# Patient Record
Sex: Male | Born: 1990 | Race: White | Hispanic: No | Marital: Married | Smoking: Never smoker
Health system: Southern US, Community
[De-identification: ages and names within clinical notes are randomized; demographics above are authoritative.]

---

## 2017-03-04 ENCOUNTER — Encounter (HOSPITAL_COMMUNITY): Payer: Self-pay

## 2017-03-04 ENCOUNTER — Emergency Department (HOSPITAL_COMMUNITY): Payer: BC Managed Care – PPO

## 2017-03-04 ENCOUNTER — Emergency Department
Admission: EM | Admit: 2017-03-04 | Discharge: 2017-03-04 | Disposition: A | Discharge status: Home / Self Care | Payer: BC Managed Care – PPO | Attending: Emergency Medicine | Admitting: Emergency Medicine

## 2017-03-04 DIAGNOSIS — R079 Chest pain, unspecified: Secondary | ICD-10-CM | POA: Insufficient documentation

## 2017-03-04 LAB — BASIC METABOLIC PANEL
ANION GAP: 8 mmol/L
BUN/CREA RATIO: 20
BUN: 19 mg/dL (ref 10–25)
CALCIUM: 9.7 mg/dL (ref 8.2–10.2)
CHLORIDE: 100 mmol/L (ref 98–111)
CO2 TOTAL: 29 mmol/L (ref 21–35)
CREATININE: 0.94 mg/dL (ref <=1.30)
ESTIMATED GFR: >60 mL/min/1.73mˆ2
GLUCOSE: 94 mg/dL (ref 70–110)
POTASSIUM: 3.6 mmol/L (ref 3.5–5.0)
SODIUM: 137 mmol/L (ref 135–145)

## 2017-03-04 LAB — CBC WITH DIFF
BASOPHIL #: 0 x10ˆ3/uL (ref 0.00–0.20)
BASOPHIL %: 1 %
EOSINOPHIL #: 0.3 x10ˆ3/uL (ref 0.00–0.50)
EOSINOPHIL %: 5 %
HCT: 46 % (ref 38.9–50.5)
HGB: 16.1 g/dL (ref 13.4–17.3)
LYMPHOCYTE #: 2.1 x10ˆ3/uL (ref 0.80–3.20)
LYMPHOCYTE %: 31 %
MCH: 30.1 pg (ref 27.9–33.1)
MCHC: 35 g/dL (ref 32.8–36.0)
MCV: 86 fL (ref 82.4–95.0)
MONOCYTE #: 0.9 x10ˆ3/uL — ABNORMAL HIGH (ref 0.20–0.80)
MONOCYTE %: 13 %
MPV: 7.1 fL (ref 6.0–10.2)
NEUTROPHIL #: 3.4 x10ˆ3/uL (ref 1.60–5.50)
NEUTROPHIL %: 50 %
PLATELETS: 286 x10ˆ3/uL (ref 140–440)
RBC: 5.34 10*6/uL (ref 4.40–5.68)
RDW: 13.3 % (ref 10.9–15.1)
WBC: 6.8 x10ˆ3/uL (ref 3.3–9.3)

## 2017-03-04 LAB — ECG 12 LEAD - ED USE
Calculated P Axis: 59 deg
Calculated T Axis: 16 deg
Heart Rate: 69 {beats}/min
I 40 Axis: 48 deg
QRS Axis: 70 deg
QRS Duration: 86 ms
QT Interval: 371 ms
QTC Calculation: 398 ms
QTC Calculation: 398 ms
T 40 Axis: 98 deg

## 2017-03-04 LAB — TROPONIN-I: TROPONIN I: 0 ng/mL (ref <=0.04)

## 2017-03-04 NOTE — ED Provider Notes (Signed)
[facility]  Emergency Department  HPI - 03/04/2017    Chief Complaint:   Chest tightness    History of Present Illness:   Devon Grimes, 26 y.o. male   Significant PMH: None   The patient presents to the ED via EMS for evaluation of chest tightness which occurred just PTA while driving. States he developed a tightness in his lower chest and upper abdomen with associated paresthesias to his posterior neck, tingling to his LUE, and lightheadedness. His symptoms lasted ~15 minutes ago and are currently resolved. He does report a similar episode in the past for which he underwent evaluation at that time, which he reports was negative. He denies recent injuries, surgeries, or hospitalizations. He denies PMHx, including anxiety, or daily medications. FHx of CAD and anxiety, but no FHx of sudden cardiac death. States intermittent alcohol use, noting one case of beer over the course of several days. He denies tobacco or drug use. No associated nausea, vomiting, diarrhea, constipation, melena, hematochezia, difficulty urinating, dysuria, hematuria, dark-colored urine, abdominal pain, dizziness, tinnitus, recent illness, shortness of breath, diaphoresis, cough, vision changes, hemoptysis, leg swelling, calf pain, and all other complaints at this time. Vaccines are UTD. Of note, he denies any recent chemical exposures.      History Limitations: None    Review of Systems:   Constitutional: No fever, chills, weakness, recent illness   Skin: No rashes, laceration, wounds, diaphoresis   HENT: No headaches, congestion, tinnitus   Eyes: No vision changes, discharge   Cardio: No palpitations or leg swelling, +chest tightness   Respiratory: No cough, wheezing, SOB, hemoptysis   GI:  No nausea, vomiting, diarrhea, constipation, melena, hematochezia, abdominal pain   GU: No dysuria, hematuria, polyuria, difficulty urinating, dark-colored urine   MSK: No joint or back pain, calf pain   Neuro: No focal deficits, dizziness,  +paresthesias to posterior neck, tingling to LUE, lightheadedness  All others negative except those mentioned in the HPI     Medications:  None     Allergies:  No Known Allergies    Past Medical History:  History reviewed. No pertinent past medical history.    Past Surgical History:  History reviewed. No past surgical history pertinent negatives.    Social History:  Social History     Social History   . Marital status: Married     Spouse name: N/A   . Number of children: N/A   . Years of education: N/A     Occupational History   . Not on file.     Social History Main Topics   . Smoking status: Not on file   . Smokeless tobacco: Not on file   . Alcohol use Not on file   . Drug use: Not on file   . Sexual activity: Not on file     Other Topics Concern   . Not on file     Social History Narrative   . No narrative on file     Family History:  Family Medical History:     None        Physical Exam:  All nurse's notes reviewed.  ED Triage Vitals   Enc Vitals Group      BP (Non-Invasive) 03/04/17 0006 151/96      Heart Rate 03/04/17 0006 69      Respiratory Rate 03/04/17 0006 16      Temperature 03/04/17 0006 36.2 C (97.2 F)      Temp src --  SpO2-1 03/04/17 0006 99 %      Weight 03/04/17 0006 72.6 kg (160 lb)      Height --       Patient does not need supplemental oxygen     Constitutional: NAD. A+Ox3   HENT:    Head: NC AT    Mouth/Throat: Oropharynx is clear and moist.    Eyes: PERRL, EOMI, Conjunctivae without discharge   Neck: Trachea midline.    Cardiovascular: RRR, No murmurs, rubs or gallops.    Pulmonary/Chest: BS equal bilaterally, good air movement. No respiratory distress. No wheezes, rales or chest tenderness.    Abdominal: BS +. Abdomen soft, no tenderness, rebound or guarding.                Musculoskeletal: No obvious deformity, swelling.  No tenderness   Skin: Warm and dry. No rash, erythema, pallor or cyanosis   Psychiatric: Behavior is normal. Mood and affect congruent.      Neurological: Alert&Ox3. Grossly intact.     Labs:  Results for orders placed or performed during the hospital encounter of 03/04/17 (from the past 24 hour(s))   BASIC METABOLIC PANEL   Result Value Ref Range    SODIUM 137 135 - 145 mmol/L    POTASSIUM 3.6 3.5 - 5.0 mmol/L    CHLORIDE 100 98 - 111 mmol/L    CO2 TOTAL 29 21 - 35 mmol/L    ANION GAP 8 mmol/L    CALCIUM 9.7 8.2 - 10.2 mg/dL    GLUCOSE 94 70 - 981 mg/dL    BUN 19 10 - 25 mg/dL    CREATININE 1.91 <=4.78 mg/dL    BUN/CREA RATIO 20     ESTIMATED GFR >60 Avg: 116 mL/min/1.44m^2   CBC/DIFF    Narrative    The following orders were created for panel order CBC/DIFF.  Procedure                               Abnormality         Status                     ---------                               -----------         ------                     CBC WITH DIFF[[id number]]                Abnormal            Final result                 Please view results for these tests on the individual orders.   TROPONIN-I   Result Value Ref Range    TROPONIN I 0.00 <=0.04 ng/mL   CBC WITH DIFF   Result Value Ref Range    WBC 6.8 3.3 - 9.3 x10^3/uL    RBC 5.34 4.40 - 5.68 x10^6/uL    HGB 16.1 13.4 - 17.3 g/dL    HCT 29.5 62.1 - 30.8 %    MCV 86.0 82.4 - 95.0 fL    MCH 30.1 27.9 - 33.1 pg    MCHC 35.0 32.8 - 36.0 g/dL    RDW 65.7 84.6 - 96.2 %  PLATELETS 286 140 - 440 x10^3/uL    MPV 7.1 6.0 - 10.2 fL    NEUTROPHIL % 50 %    LYMPHOCYTE % 31 %    MONOCYTE % 13 %    EOSINOPHIL % 5 %    BASOPHIL % 1 %    NEUTROPHIL # 3.40 1.60 - 5.50 x10^3/uL    LYMPHOCYTE # 2.10 0.80 - 3.20 x10^3/uL    MONOCYTE # 0.90 (H) 0.20 - 0.80 x10^3/uL    EOSINOPHIL # 0.30 0.00 - 0.50 x10^3/uL    BASOPHIL # 0.00 0.00 - 0.20 x10^3/uL     Imaging:  Results for orders placed or performed during the hospital encounter of 03/04/17 (from the past 72 hour(s))   XR CHEST PA AND LATERAL     Status: None    Narrative    Alfie Przybysz    PROCEDURE DESCRIPTION: XR CHEST PA AND LATERAL    PROCEDURE PERFORMED DATE AND TIME:  03/04/2017 1:00 AM    CLINICAL INDICATION: chest pain    The lungs appear clear. There is no evidence of pneumonia, edema or  effusion. Cardiomediastinal silhouette is normal.      Impression    No acute pulmonary disease.         Orders Placed This Encounter   . XR CHEST PA AND LATERAL   . BASIC METABOLIC PANEL   . CBC/DIFF   . TROPONIN-I   . CBC WITH DIFF   . ECG 12 LEAD - ED USE   . SCHEDULE FOLLOW-UP [facility] [place]     Abnormal Lab results:  Labs Reviewed   CBC WITH DIFF - Abnormal; Notable for the following:        Result Value    MONOCYTE # 0.90 (*)     All other components within normal limits   TROPONIN-I - Normal   BASIC METABOLIC PANEL   CBC/DIFF    Narrative:     The following orders were created for panel order CBC/DIFF.  Procedure                               Abnormality         Status                     ---------                               -----------         ------                     CBC WITH DIFF[[id number]]                Abnormal            Final result                 Please view results for these tests on the individual orders.       ECG:  Normal sinus rhythm at a rate of 69.   No ischemic changes concerning for MI, No arrhythmia    Plan: Appropriate labs and imaging ordered. Medical Records reviewed.    Therapy/Procedures/Course/MDM:    Patient was vitally stable throughout visit.    Chest x-ray negative for acute process per Radiology.   Lab work unremarkable. Troponin 0.00.  EKG reassuring.  Patient feels better and no longer complains chest pain. Patient advised to follow up with his primary care doctor will be given referral to [facility] and he does not have primary care doctor.   Results discussed with patient.  They had improvement with initial ED management. They were given the opportunity to ask questions.    Consults:  None  Critical Care Time:   None  Impression:   Encounter Diagnosis   Name Primary?   . Chest pain,  unspecified type Yes     Disposition:         Following the above history, physical exam, and studies, the patient was discharged.   he will follow up in 1-2 days with his primary care doctor or [facility].    It was advised that the patient return to the ED with any new, concerning or worsening symptoms and follow up as directed.    The patient and their family verbalized understanding of all instructions and had no further questions or concerns.         I am scribing for, and in the presence of, Dr. Ander Gaster for services provided on 03/04/2017.  Raj Janus, SCRIBE     I personally performed the services described in this documentation, as scribed  in my presence, and it is both accurate  and complete.    Charletta Cousin, MD    Charletta Cousin, MD  03/04/2017, 03:04

## 2017-03-04 NOTE — ED Nurses Note (Signed)
Pt AAOx4, no request or complaints.

## 2018-04-29 DIAGNOSIS — N4340 Spermatocele of epididymis, unspecified: Secondary | ICD-10-CM | POA: Diagnosis not present

## 2018-06-23 DIAGNOSIS — F411 Generalized anxiety disorder: Secondary | ICD-10-CM | POA: Diagnosis not present

## 2018-12-20 DIAGNOSIS — F411 Generalized anxiety disorder: Secondary | ICD-10-CM | POA: Diagnosis not present

## 2018-12-20 DIAGNOSIS — F332 Major depressive disorder, recurrent severe without psychotic features: Secondary | ICD-10-CM | POA: Diagnosis not present

## 2019-01-20 DIAGNOSIS — F332 Major depressive disorder, recurrent severe without psychotic features: Secondary | ICD-10-CM | POA: Diagnosis not present

## 2019-01-20 DIAGNOSIS — F411 Generalized anxiety disorder: Secondary | ICD-10-CM | POA: Diagnosis not present

## 2019-01-28 DIAGNOSIS — F411 Generalized anxiety disorder: Secondary | ICD-10-CM | POA: Diagnosis not present

## 2019-01-28 DIAGNOSIS — F332 Major depressive disorder, recurrent severe without psychotic features: Secondary | ICD-10-CM | POA: Diagnosis not present

## 2019-01-28 DIAGNOSIS — F410 Panic disorder [episodic paroxysmal anxiety] without agoraphobia: Secondary | ICD-10-CM | POA: Diagnosis not present

## 2019-07-07 DIAGNOSIS — R42 Dizziness and giddiness: Secondary | ICD-10-CM | POA: Diagnosis not present

## 2019-07-10 ENCOUNTER — Observation Stay
Admission: EM | Admit: 2019-07-10 | Discharge: 2019-07-11 | Disposition: A | Discharge status: Home / Self Care | Payer: BC Managed Care – PPO | Attending: General Surgery | Admitting: General Surgery

## 2019-07-10 ENCOUNTER — Observation Stay: Payer: BC Managed Care – PPO | Admitting: Anesthesiology

## 2019-07-10 ENCOUNTER — Encounter
Admission: EM | Disposition: A | Discharge status: Home / Self Care | Payer: Self-pay | Source: Home / Self Care | Attending: Emergency Medicine

## 2019-07-10 ENCOUNTER — Encounter: Payer: Self-pay | Admitting: Emergency Medicine

## 2019-07-10 ENCOUNTER — Other Ambulatory Visit: Payer: Self-pay

## 2019-07-10 ENCOUNTER — Emergency Department: Payer: BC Managed Care – PPO

## 2019-07-10 DIAGNOSIS — K353 Acute appendicitis with localized peritonitis, without perforation or gangrene: Secondary | ICD-10-CM

## 2019-07-10 DIAGNOSIS — K3580 Unspecified acute appendicitis: Secondary | ICD-10-CM

## 2019-07-10 DIAGNOSIS — R109 Unspecified abdominal pain: Secondary | ICD-10-CM | POA: Diagnosis not present

## 2019-07-10 DIAGNOSIS — Z20822 Contact with and (suspected) exposure to covid-19: Secondary | ICD-10-CM | POA: Diagnosis not present

## 2019-07-10 DIAGNOSIS — Z03818 Encounter for observation for suspected exposure to other biological agents ruled out: Secondary | ICD-10-CM | POA: Diagnosis not present

## 2019-07-10 DIAGNOSIS — K3533 Acute appendicitis with perforation and localized peritonitis, with abscess: Principal | ICD-10-CM | POA: Insufficient documentation

## 2019-07-10 HISTORY — PX: LAPAROSCOPIC APPENDECTOMY: SHX408

## 2019-07-10 LAB — CBC
HCT: 43.7 % (ref 39.0–52.0)
Hemoglobin: 15.2 g/dL (ref 13.0–17.0)
MCH: 29.3 pg (ref 26.0–34.0)
MCHC: 34.8 g/dL (ref 30.0–36.0)
MCV: 84.4 fL (ref 80.0–100.0)
Platelets: 275 10*3/uL (ref 150–400)
RBC: 5.18 MIL/uL (ref 4.22–5.81)
RDW: 12 % (ref 11.5–15.5)
WBC: 10.5 10*3/uL (ref 4.0–10.5)
nRBC: 0 % (ref 0.0–0.2)

## 2019-07-10 LAB — LIPASE, BLOOD: Lipase: 25 U/L (ref 11–51)

## 2019-07-10 LAB — URINALYSIS, COMPLETE (UACMP) WITH MICROSCOPIC
Bacteria, UA: NONE SEEN
Bilirubin Urine: NEGATIVE
Glucose, UA: NEGATIVE mg/dL
Hgb urine dipstick: NEGATIVE
Ketones, ur: NEGATIVE mg/dL
Leukocytes,Ua: NEGATIVE
Nitrite: NEGATIVE
Protein, ur: NEGATIVE mg/dL
Specific Gravity, Urine: 1.031 — ABNORMAL HIGH (ref 1.005–1.030)
Squamous Epithelial / HPF: NONE SEEN (ref 0–5)
WBC, UA: NONE SEEN WBC/hpf (ref 0–5)
pH: 6 (ref 5.0–8.0)

## 2019-07-10 LAB — HIV ANTIBODY (ROUTINE TESTING W REFLEX): HIV Screen 4th Generation wRfx: NONREACTIVE

## 2019-07-10 LAB — COMPREHENSIVE METABOLIC PANEL
ALT: 13 U/L (ref 0–44)
AST: 17 U/L (ref 15–41)
Albumin: 4.4 g/dL (ref 3.5–5.0)
Alkaline Phosphatase: 49 U/L (ref 38–126)
Anion gap: 9 (ref 5–15)
BUN: 17 mg/dL (ref 6–20)
CO2: 27 mmol/L (ref 22–32)
Calcium: 9.1 mg/dL (ref 8.9–10.3)
Chloride: 104 mmol/L (ref 98–111)
Creatinine, Ser: 0.91 mg/dL (ref 0.61–1.24)
GFR calc Af Amer: >60 mL/min (ref >60)
GFR calc non Af Amer: >60 mL/min (ref >60)
Glucose, Bld: 101 mg/dL — ABNORMAL HIGH (ref 70–99)
Potassium: 3.3 mmol/L — ABNORMAL LOW (ref 3.5–5.1)
Sodium: 140 mmol/L (ref 135–145)
Total Bilirubin: 0.5 mg/dL (ref 0.3–1.2)
Total Protein: 8 g/dL (ref 6.5–8.1)

## 2019-07-10 LAB — SURGICAL PCR SCREEN
MRSA, PCR: NEGATIVE
Staphylococcus aureus: NEGATIVE

## 2019-07-10 LAB — RESPIRATORY PANEL BY RT PCR (FLU A&B, COVID)
Influenza A by PCR: NEGATIVE
Influenza B by PCR: NEGATIVE
SARS Coronavirus 2 by RT PCR: NEGATIVE

## 2019-07-10 SURGERY — APPENDECTOMY, LAPAROSCOPIC
Anesthesia: General

## 2019-07-10 SURGERY — APPENDECTOMY, LAPAROSCOPIC
Anesthesia: General | Site: Abdomen

## 2019-07-10 MED ORDER — ONDANSETRON 4 MG PO TBDP: 4.0000 mg | ORAL_TABLET | Freq: Four times a day (QID) | ORAL | Status: DC | PRN

## 2019-07-10 MED ORDER — MUPIROCIN 2 % EX OINT
1.0000 "application " | TOPICAL_OINTMENT | Freq: Two times a day (BID) | CUTANEOUS | Status: DC
  Administered 2019-07-10: 1 via NASAL
  Filled 2019-07-10: qty 22

## 2019-07-10 MED ORDER — PHENYLEPHRINE HCL (PRESSORS) 10 MG/ML IV SOLN
INTRAVENOUS | Status: DC | PRN
  Administered 2019-07-10: 50 ug via INTRAVENOUS

## 2019-07-10 MED ORDER — PIPERACILLIN-TAZOBACTAM 3.375 G IVPB 30 MIN
3.3750 g | Freq: Once | INTRAVENOUS | Status: AC
  Administered 2019-07-10: 08:00:00 3.375 g via INTRAVENOUS
  Filled 2019-07-10: qty 50

## 2019-07-10 MED ORDER — IBUPROFEN 400 MG PO TABS: 600.0000 mg | ORAL_TABLET | ORAL | Status: DC | PRN

## 2019-07-10 MED ORDER — ROCURONIUM BROMIDE 100 MG/10ML IV SOLN
INTRAVENOUS | Status: DC | PRN
  Administered 2019-07-10: 30 mg via INTRAVENOUS
  Administered 2019-07-10 (×2): 10 mg via INTRAVENOUS

## 2019-07-10 MED ORDER — SODIUM CHLORIDE FLUSH 0.9 % IV SOLN
INTRAVENOUS | Status: AC
  Filled 2019-07-10: qty 20

## 2019-07-10 MED ORDER — ACETAMINOPHEN 325 MG PO TABS: 650.0000 mg | ORAL_TABLET | Freq: Four times a day (QID) | ORAL | Status: DC | PRN

## 2019-07-10 MED ORDER — MIDAZOLAM HCL 2 MG/2ML IJ SOLN
INTRAMUSCULAR | Status: DC | PRN
  Administered 2019-07-10: 2 mg via INTRAVENOUS

## 2019-07-10 MED ORDER — SUCCINYLCHOLINE CHLORIDE 20 MG/ML IJ SOLN
INTRAMUSCULAR | Status: DC | PRN
  Administered 2019-07-10: 80 mg via INTRAVENOUS

## 2019-07-10 MED ORDER — SUGAMMADEX SODIUM 200 MG/2ML IV SOLN
INTRAVENOUS | Status: DC | PRN
  Administered 2019-07-10: 100 mg via INTRAVENOUS
  Administered 2019-07-10: 200 mg via INTRAVENOUS

## 2019-07-10 MED ORDER — MORPHINE SULFATE (PF) 2 MG/ML IV SOLN: 2.0000 mg | INTRAVENOUS | Status: DC | PRN

## 2019-07-10 MED ORDER — POLYETHYLENE GLYCOL 3350 17 G PO PACK
17.0000 g | PACK | Freq: Every day | ORAL | Status: DC
  Administered 2019-07-10 – 2019-07-11 (×2): 17 g via ORAL
  Filled 2019-07-10 (×2): qty 1

## 2019-07-10 MED ORDER — DEXAMETHASONE SODIUM PHOSPHATE 10 MG/ML IJ SOLN
INTRAMUSCULAR | Status: DC | PRN
  Administered 2019-07-10: 4 mg via INTRAVENOUS

## 2019-07-10 MED ORDER — OXYCODONE HCL 5 MG/5ML PO SOLN: 5.0000 mg | Freq: Once | ORAL | Status: DC | PRN

## 2019-07-10 MED ORDER — LIDOCAINE-EPINEPHRINE (PF) 1 %-1:200000 IJ SOLN
INTRAMUSCULAR | Status: DC | PRN
  Administered 2019-07-10: 10 mL via INTRAMUSCULAR

## 2019-07-10 MED ORDER — FENTANYL CITRATE (PF) 100 MCG/2ML IJ SOLN
INTRAMUSCULAR | Status: AC
  Administered 2019-07-10: 17:00:00 25 ug via INTRAVENOUS
  Filled 2019-07-10: qty 2

## 2019-07-10 MED ORDER — HYDROCODONE-ACETAMINOPHEN 5-325 MG PO TABS
1.0000 | ORAL_TABLET | ORAL | Status: DC | PRN
  Administered 2019-07-10: 2 via ORAL
  Filled 2019-07-10: qty 2

## 2019-07-10 MED ORDER — POTASSIUM CHLORIDE IN NACL 20-0.9 MEQ/L-% IV SOLN
INTRAVENOUS | Status: DC
  Filled 2019-07-10 (×7): qty 1000

## 2019-07-10 MED ORDER — "VISTASEAL 4 ML SINGLE DOSE KIT "
PACK | CUTANEOUS | Status: DC | PRN
  Administered 2019-07-10: 4 mL via TOPICAL

## 2019-07-10 MED ORDER — DEXMEDETOMIDINE HCL IN NACL 200 MCG/50ML IV SOLN
INTRAVENOUS | Status: DC | PRN
  Administered 2019-07-10: 4 ug via INTRAVENOUS
  Administered 2019-07-10: 8 ug via INTRAVENOUS
  Administered 2019-07-10 (×2): 4 ug via INTRAVENOUS

## 2019-07-10 MED ORDER — LACTATED RINGERS IV SOLN: INTRAVENOUS | Status: DC

## 2019-07-10 MED ORDER — PIPERACILLIN-TAZOBACTAM 3.375 G IVPB
3.3750 g | Freq: Three times a day (TID) | INTRAVENOUS | Status: DC
  Administered 2019-07-10 – 2019-07-11 (×2): 3.375 g via INTRAVENOUS
  Filled 2019-07-10 (×3): qty 50

## 2019-07-10 MED ORDER — ONDANSETRON HCL 4 MG/2ML IJ SOLN: 4.0000 mg | Freq: Once | INTRAMUSCULAR | Status: DC | PRN

## 2019-07-10 MED ORDER — DOCUSATE SODIUM 100 MG PO CAPS: 100.0000 mg | ORAL_CAPSULE | Freq: Every day | ORAL | Status: DC | PRN

## 2019-07-10 MED ORDER — IOHEXOL 300 MG/ML  SOLN
100.0000 mL | Freq: Once | INTRAMUSCULAR | Status: AC | PRN
  Administered 2019-07-10: 06:00:00 100 mL via INTRAVENOUS

## 2019-07-10 MED ORDER — FENTANYL CITRATE (PF) 100 MCG/2ML IJ SOLN
INTRAMUSCULAR | Status: DC | PRN
  Administered 2019-07-10: 50 ug via INTRAVENOUS
  Administered 2019-07-10: 25 ug via INTRAVENOUS
  Administered 2019-07-10: 50 ug via INTRAVENOUS

## 2019-07-10 MED ORDER — LIDOCAINE HCL (CARDIAC) PF 100 MG/5ML IV SOSY
PREFILLED_SYRINGE | INTRAVENOUS | Status: DC | PRN
  Administered 2019-07-10: 100 mg via INTRAVENOUS

## 2019-07-10 MED ORDER — ACETAMINOPHEN 10 MG/ML IV SOLN: 1000.0000 mg | Freq: Once | INTRAVENOUS | Status: DC | PRN

## 2019-07-10 MED ORDER — FENTANYL CITRATE (PF) 100 MCG/2ML IJ SOLN
25.0000 ug | INTRAMUSCULAR | Status: DC | PRN
  Administered 2019-07-10: 17:00:00 25 ug via INTRAVENOUS

## 2019-07-10 MED ORDER — ONDANSETRON HCL 4 MG/2ML IJ SOLN
INTRAMUSCULAR | Status: AC
  Filled 2019-07-10: qty 2

## 2019-07-10 MED ORDER — HYDROMORPHONE HCL 1 MG/ML IJ SOLN: 0.5000 mg | INTRAMUSCULAR | Status: DC | PRN

## 2019-07-10 MED ORDER — OXYCODONE HCL 5 MG PO TABS: 5.0000 mg | ORAL_TABLET | Freq: Once | ORAL | Status: DC | PRN

## 2019-07-10 MED ORDER — PROPOFOL 10 MG/ML IV BOLUS
INTRAVENOUS | Status: DC | PRN
  Administered 2019-07-10: 200 mg via INTRAVENOUS
  Administered 2019-07-10: 30 mg via INTRAVENOUS
  Administered 2019-07-10: 20 mg via INTRAVENOUS

## 2019-07-10 MED ORDER — ONDANSETRON HCL 4 MG/2ML IJ SOLN
4.0000 mg | Freq: Four times a day (QID) | INTRAMUSCULAR | Status: DC | PRN
  Administered 2019-07-10: 4 mg via INTRAVENOUS

## 2019-07-10 SURGICAL SUPPLY — 52 items
APPLICATOR COTTON TIP 6 STRL (MISCELLANEOUS) ×1 IMPLANT
APPLICATOR COTTON TIP 6IN STRL (MISCELLANEOUS) ×2
APPLIER CLIP 5 13 M/L LIGAMAX5 (MISCELLANEOUS)
BLADE SURG SZ11 CARB STEEL (BLADE) ×2 IMPLANT
CANISTER SUCT 1200ML W/VALVE (MISCELLANEOUS) ×2 IMPLANT
CHLORAPREP W/TINT 26 (MISCELLANEOUS) ×2 IMPLANT
CLIP APPLIE 5 13 M/L LIGAMAX5 (MISCELLANEOUS) IMPLANT
COVER WAND RF STERILE (DRAPES) ×2 IMPLANT
CUTTER FLEX LINEAR 45M (STAPLE) ×2 IMPLANT
DEFOGGER SCOPE WARMER CLEARIFY (MISCELLANEOUS) ×2 IMPLANT
DERMABOND ADVANCED (GAUZE/BANDAGES/DRESSINGS) ×1
DERMABOND ADVANCED .7 DNX12 (GAUZE/BANDAGES/DRESSINGS) ×1 IMPLANT
ELECT CAUTERY BLADE 6.4 (BLADE) ×2 IMPLANT
ELECT CAUTERY BLADE TIP 2.5 (TIP) ×2
ELECT REM PT RETURN 9FT ADLT (ELECTROSURGICAL) ×2
ELECTRODE CAUTERY BLDE TIP 2.5 (TIP) ×1 IMPLANT
ELECTRODE REM PT RTRN 9FT ADLT (ELECTROSURGICAL) ×1 IMPLANT
GLOVE BIO SURGEON STRL SZ 6.5 (GLOVE) ×2 IMPLANT
GLOVE INDICATOR 7.0 STRL GRN (GLOVE) ×4 IMPLANT
GOWN STRL REUS W/ TWL LRG LVL3 (GOWN DISPOSABLE) ×2 IMPLANT
GOWN STRL REUS W/TWL LRG LVL3 (GOWN DISPOSABLE) ×2
GRASPER SUT TROCAR 14GX15 (MISCELLANEOUS) ×2 IMPLANT
IRRIGATION STRYKERFLOW (MISCELLANEOUS) ×1 IMPLANT
IRRIGATOR STRYKERFLOW (MISCELLANEOUS) ×2
IV NS 1000ML (IV SOLUTION) ×1
IV NS 1000ML BAXH (IV SOLUTION) ×1 IMPLANT
KIT TURNOVER KIT A (KITS) ×2 IMPLANT
KITTNER LAPARASCOPIC 5X40 (MISCELLANEOUS) ×2 IMPLANT
LABEL OR SOLS (LABEL) ×2 IMPLANT
LIGASURE LAP MARYLAND 5MM 37CM (ELECTROSURGICAL) IMPLANT
NEEDLE HYPO 22GX1.5 SAFETY (NEEDLE) ×2 IMPLANT
NS IRRIG 500ML POUR BTL (IV SOLUTION) ×2 IMPLANT
PACK LAP CHOLECYSTECTOMY (MISCELLANEOUS) ×2 IMPLANT
PENCIL ELECTRO HAND CTR (MISCELLANEOUS) ×2 IMPLANT
POUCH SPECIMEN RETRIEVAL 10MM (ENDOMECHANICALS) ×2 IMPLANT
RELOAD STAPLE TA45 3.5 REG BLU (ENDOMECHANICALS) ×2 IMPLANT
SCISSORS METZENBAUM CVD 33 (INSTRUMENTS) ×2 IMPLANT
SET TUBE SMOKE EVAC HIGH FLOW (TUBING) ×2 IMPLANT
SHEARS HARMONIC ACE PLUS 36CM (ENDOMECHANICALS) ×2 IMPLANT
SLEEVE ADV FIXATION 5X100MM (TROCAR) ×4 IMPLANT
STRIP CLOSURE SKIN 1/2X4 (GAUZE/BANDAGES/DRESSINGS) ×2 IMPLANT
SUT MNCRL 4-0 (SUTURE) ×1
SUT MNCRL 4-0 27XMFL (SUTURE) ×1
SUT VIC AB 3-0 SH 27 (SUTURE) ×1
SUT VIC AB 3-0 SH 27X BRD (SUTURE) ×1 IMPLANT
SUT VICRYL 0 AB UR-6 (SUTURE) ×2 IMPLANT
SUTURE MNCRL 4-0 27XMF (SUTURE) ×1 IMPLANT
SYS KII FIOS ACCESS ABD 5X100 (TROCAR) ×2
SYSTEM KII FIOS ACES ABD 5X100 (TROCAR) ×1 IMPLANT
TRAY FOLEY MTR SLVR 16FR STAT (SET/KITS/TRAYS/PACK) ×2 IMPLANT
TROCAR 130MM GELPORT  DAV (MISCELLANEOUS) ×2 IMPLANT
TROCAR ADV FIXATION 12X100MM (TROCAR) IMPLANT

## 2019-07-10 SURGICAL SUPPLY — 55 items
APPLICATOR COTTON TIP 6 STRL (MISCELLANEOUS) ×1 IMPLANT
APPLICATOR COTTON TIP 6IN STRL (MISCELLANEOUS) ×2
APPLICATOR VISTASEAL 35 (MISCELLANEOUS) ×2 IMPLANT
APPLIER CLIP 5 13 M/L LIGAMAX5 (MISCELLANEOUS) ×2
BLADE SURG SZ11 CARB STEEL (BLADE) ×2 IMPLANT
CANISTER SUCT 1200ML W/VALVE (MISCELLANEOUS) ×2 IMPLANT
CHLORAPREP W/TINT 26 (MISCELLANEOUS) ×2 IMPLANT
CLIP APPLIE 5 13 M/L LIGAMAX5 (MISCELLANEOUS) ×1 IMPLANT
COVER WAND RF STERILE (DRAPES) ×2 IMPLANT
CUTTER FLEX LINEAR 45M (STAPLE) ×2 IMPLANT
DEFOGGER SCOPE WARMER CLEARIFY (MISCELLANEOUS) ×2 IMPLANT
DERMABOND ADVANCED (GAUZE/BANDAGES/DRESSINGS) ×1
DERMABOND ADVANCED .7 DNX12 (GAUZE/BANDAGES/DRESSINGS) ×1 IMPLANT
ELECT CAUTERY BLADE 6.4 (BLADE) ×2 IMPLANT
ELECT CAUTERY BLADE TIP 2.5 (TIP) ×2
ELECT REM PT RETURN 9FT ADLT (ELECTROSURGICAL) ×2
ELECTRODE CAUTERY BLDE TIP 2.5 (TIP) ×1 IMPLANT
ELECTRODE REM PT RTRN 9FT ADLT (ELECTROSURGICAL) ×1 IMPLANT
GLOVE BIO SURGEON STRL SZ 6.5 (GLOVE) ×2 IMPLANT
GLOVE INDICATOR 7.0 STRL GRN (GLOVE) ×4 IMPLANT
GOWN STRL REUS W/ TWL LRG LVL3 (GOWN DISPOSABLE) ×2 IMPLANT
GOWN STRL REUS W/TWL LRG LVL3 (GOWN DISPOSABLE) ×2
GRASPER SUT TROCAR 14GX15 (MISCELLANEOUS) ×2 IMPLANT
HEMOSTAT SURGICEL 2X3 (HEMOSTASIS) ×4 IMPLANT
IRRIGATION STRYKERFLOW (MISCELLANEOUS) ×1 IMPLANT
IRRIGATOR STRYKERFLOW (MISCELLANEOUS) ×2
IV NS 1000ML (IV SOLUTION) ×1
IV NS 1000ML BAXH (IV SOLUTION) ×1 IMPLANT
KIT TURNOVER KIT A (KITS) ×2 IMPLANT
KITTNER LAPARASCOPIC 5X40 (MISCELLANEOUS) ×2 IMPLANT
LABEL OR SOLS (LABEL) ×2 IMPLANT
LIGASURE LAP MARYLAND 5MM 37CM (ELECTROSURGICAL) IMPLANT
NEEDLE HYPO 22GX1.5 SAFETY (NEEDLE) ×2 IMPLANT
NS IRRIG 500ML POUR BTL (IV SOLUTION) ×2 IMPLANT
PACK LAP CHOLECYSTECTOMY (MISCELLANEOUS) ×2 IMPLANT
PENCIL ELECTRO HAND CTR (MISCELLANEOUS) ×2 IMPLANT
POUCH SPECIMEN RETRIEVAL 10MM (ENDOMECHANICALS) ×2 IMPLANT
RELOAD STAPLE TA45 3.5 REG BLU (ENDOMECHANICALS) ×2 IMPLANT
SCISSORS METZENBAUM CVD 33 (INSTRUMENTS) ×2 IMPLANT
SET TUBE SMOKE EVAC HIGH FLOW (TUBING) ×2 IMPLANT
SHEARS HARMONIC ACE PLUS 36CM (ENDOMECHANICALS) ×2 IMPLANT
SLEEVE ADV FIXATION 5X100MM (TROCAR) ×4 IMPLANT
STRIP CLOSURE SKIN 1/2X4 (GAUZE/BANDAGES/DRESSINGS) ×2 IMPLANT
SUT MNCRL 4-0 (SUTURE) ×1
SUT MNCRL 4-0 27XMFL (SUTURE) ×1
SUT VIC AB 3-0 SH 27 (SUTURE) ×1
SUT VIC AB 3-0 SH 27X BRD (SUTURE) ×1 IMPLANT
SUT VICRYL 0 AB UR-6 (SUTURE) ×2 IMPLANT
SUTURE MNCRL 4-0 27XMF (SUTURE) ×1 IMPLANT
SYS KII FIOS ACCESS ABD 5X100 (TROCAR) ×2
SYSTEM KII FIOS ACES ABD 5X100 (TROCAR) ×1 IMPLANT
TRAY FOLEY MTR SLVR 16FR STAT (SET/KITS/TRAYS/PACK) ×2 IMPLANT
TROCAR 130MM GELPORT  DAV (MISCELLANEOUS) ×2 IMPLANT
TROCAR ADV FIXATION 12X100MM (TROCAR) IMPLANT
TROCAR BALLN GELPORT 12X130M (ENDOMECHANICALS) ×2 IMPLANT

## 2019-07-10 NOTE — Anesthesia Procedure Notes (Signed)
Procedure Name: Intubation Date/Time: 07/10/2019 2:38 PM Performed by: Doreen Salvage, CRNA Pre-anesthesia Checklist: Patient identified, Patient being monitored, Timeout performed, Emergency Drugs available and Suction available Patient Re-evaluated:Patient Re-evaluated prior to induction Oxygen Delivery Method: Circle system utilized Preoxygenation: Pre-oxygenation with 100% oxygen Induction Type: IV induction and Rapid sequence Laryngoscope Size: Mac and 3 Grade View: Grade I Tube type: Oral Tube size: 7.5 mm Number of attempts: 1 Airway Equipment and Method: Stylet Placement Confirmation: ETT inserted through vocal cords under direct vision,  positive ETCO2 and breath sounds checked- equal and bilateral Secured at: 23 cm Tube secured with: Tape Dental Injury: Teeth and Oropharynx as per pre-operative assessment

## 2019-07-10 NOTE — Transfer of Care (Signed)
Immediate Anesthesia Transfer of Care Note  Patient: VIAAN KNIPPENBERG  Procedure(s) Performed: APPENDECTOMY LAPAROSCOPIC (N/A Abdomen)  Patient Location: PACU  Anesthesia Type:General  Level of Consciousness:drowsy  Airway & Oxygen Therapy: Patient Spontanous Breathing and Patient connected to face mask oxygen  Post-op Assessment: Report given to RN and Post -op Vital signs reviewed and stable  Post vital signs: Reviewed and stable  Last Vitals:  Vitals Value Taken Time  BP 105/63 07/10/19 1609  Temp 36.1 C 07/10/19 1609  Pulse 69 07/10/19 1612  Resp 19 07/10/19 1612  SpO2 100 % 07/10/19 1612  Vitals shown include unvalidated device data.  Last Pain:  Vitals:   07/10/19 1319  TempSrc: Oral  PainSc: 0-No pain         Complications: No apparent anesthesia complications

## 2019-07-10 NOTE — ED Notes (Signed)
Pt last ate/drink at 4am this morning.

## 2019-07-10 NOTE — ED Notes (Signed)
Surgical providers in room to assess patient.

## 2019-07-10 NOTE — Op Note (Signed)
Operative Note  Laparoscopic Appendectomy   Lupita Raider Date of operation:  07/10/2019  Indications: The patient presented with a history of  abdominal pain. Workup has revealed findings consistent with acute appendicitis.  Pre-operative Diagnosis: Acute appendicitis without mention of peritonitis  Post-operative Diagnosis: Same  Surgeon: Duanne Guess, MD  Anesthesia: GETA  Findings: The appendix was extremely long and retrocecal.  There was localized peritonitis mainly involving one of the tenia coli.  With manipulation, the tip of the appendix ruptured and pus oozed into the abdomen.  This was all quickly suctioned free and was not allowed to spill into the pelvis or disseminate.  Estimated Blood Loss: Less than 5 cc         Specimens: appendix         Complications: None immediately apparent  Procedure Details  The patient was seen again in the preop area. The options of surgery versus observation were reviewed with the patient and/or family. The risks of bleeding, infection, recurrence of symptoms, negative laparoscopy, potential for an open procedure, bowel injury, abscess or infection, were all reviewed as well. The patient was taken to Operating Room, identified as Lupita Raider and the procedure verified as laparoscopic appendectomy. A time out was performed and the above information confirmed.  The patient was placed in the supine position and general anesthesia was induced.  Antibiotic prophylaxis was administered (he was receiving Zosyn as a scheduled medication) and VTE prophylaxis was in place. A Foley catheter was placed by the nursing staff.   The abdomen was prepped and draped in a sterile fashion. An infraumbilical incision was made. A cutdown technique was used to enter the abdominal cavity. Two vicryl stitches were placed on the fascia and a Hasson trocar inserted. Pneumoperitoneum obtained. Two 5 mm ports were placed under direct visualization.  The appendix was  identified and found to be acutely inflamed and retrocecal.  The base of the appendix appeared relatively normal, however as the appendix was fished out from behind the cecum, there was some fibrinous exudate present and the tip of the appendix ruptured, oozing pus.  This was suctioned away rapidly and was not allowed to spread anywhere in the abdomen or pelvis. The appendix was carefully dissected. The mesoappendix was divided with the Harmonic scalpel. The base of the appendix was dissected out and divided with a standard load Endo GIA.The appendix was placed in a Endo pouch bag and removed via the Hasson port. The right lower quadrant and pelvis was then irrigated with normal saline which was then aspirated. The right lower quadrant was inspected there was no sign of significant bleeding or bowel injury, however there was a small amount of oozing at the staple line, as well as from a very inflamed tenia coli.  Surgicel and Vistaseal were applied with good hemostatic results.  Further evaluation of the abdomen and pelvis did not show any additional bleeding or purulent material, therefore pneumoperitoneum was released, all ports were removed.  The umbilical fascia was closed with 0 Vicryl interrupted sutures and the skin incisions were approximated with subcuticular 4-0 Monocryl. Dermabond was applied, followed by Steri-Strips The patient tolerated the procedure well and there were no immediately apparent complications. The sponge lap and needle count were correct at the end of the procedure.  The patient was taken to the recovery room in stable condition to be admitted for continued care.   Duanne Guess, MD, FACS

## 2019-07-10 NOTE — ED Provider Notes (Signed)
[facility] Emergency Department Provider Note  ____________________________________________   First MD Initiated Contact with Patient 07/10/19 0559     (approximate)  I have reviewed the triage vital signs and the nursing notes.   HISTORY  Chief Complaint Abdominal Pain    HPI Ralph Malone is a 29 y.o. male with below list of previous medical conditions presents with lower abdominal pain and constipation x3 days.  Patient states that he had diarrhea over the weekend and then subsequently since Monday has not had a bowel movement.  Patient denies any fever.  Patient denies any nausea or vomiting.  Patient also admits to dysuria however no hematuria.        History reviewed. No pertinent past medical history.  There are no problems to display for this patient.   History reviewed. No pertinent surgical history.  Prior to Admission medications   Not on File    Allergies Patient has no known allergies.  No family history on file.  Social History Social History   Tobacco Use  . Smoking status: Never Smoker  . Smokeless tobacco: Never Used  Substance Use Topics  . Alcohol use: Not on file  . Drug use: Not on file    Review of Systems Constitutional: No fever/chills Eyes: No visual changes. ENT: No sore throat. Cardiovascular: Denies chest pain. Respiratory: Denies shortness of breath. Gastrointestinal: Positive for abdominal pain.  No nausea, no vomiting.  No diarrhea.  No constipation. Genitourinary: Negative for dysuria. Musculoskeletal: Negative for neck pain.  Negative for back pain. Integumentary: Negative for rash. Neurological: Negative for headaches, focal weakness or numbness.   ____________________________________________   PHYSICAL EXAM:  VITAL SIGNS: ED Triage Vitals [07/10/19 0532]  Enc Vitals Group     BP 127/82     Pulse Rate 90     Resp 18     Temp 97.9 F (36.6 C)     Temp Source Oral     SpO2 98 %   Weight 72.6 kg (160 lb)     Height 1.676 m (5\' 6" )     Head Circumference      Peak Flow      Pain Score 2     Pain Loc      Pain Edu?      Excl. in [place]?     Constitutional: Alert and oriented.  Eyes: Conjunctivae are normal.  Mouth/Throat: Patient is wearing a mask. Neck: No stridor.  No meningeal signs.   Cardiovascular: Normal rate, regular rhythm. Good peripheral circulation. Grossly normal heart sounds. Respiratory: Normal respiratory effort.  No retractions. Gastrointestinal: Soft and nontender. No distention.  Musculoskeletal: No lower extremity tenderness nor edema. No gross deformities of extremities. Neurologic:  Normal speech and language. No gross focal neurologic deficits are appreciated.  Skin:  Skin is warm, dry and intact. Psychiatric: Mood and affect are normal. Speech and behavior are normal.  ____________________________________________   LABS (all labs ordered are listed, but only abnormal results are displayed)  Labs Reviewed  COMPREHENSIVE METABOLIC PANEL - Abnormal; Notable for the following components:      Result Value   Potassium 3.3 (*)    Glucose, Bld 101 (*)    All other components within normal limits  LIPASE, BLOOD  CBC  URINALYSIS, COMPLETE (UACMP) WITH MICROSCOPIC     RADIOLOGY I, Gifford N Rey Dansby, personally viewed and evaluated these images (plain radiographs) as part of my medical decision making, as well as reviewing the written report by  the radiologist.  ED MD interpretation: Acute appendicitis as specified by radiologist below.  Official radiology report(s): CT ABDOMEN PELVIS W CONTRAST  Result Date: 07/10/2019 CLINICAL DATA:  Lower abdominal pain with constipation for 3-4 days. EXAM: CT ABDOMEN AND PELVIS WITH CONTRAST TECHNIQUE: Multidetector CT imaging of the abdomen and pelvis was performed using the standard protocol following bolus administration of intravenous contrast. CONTRAST:  OMNIPAQUE IOHEXOL 300 MG/ML  SOLN  COMPARISON:  None. FINDINGS: Lower chest:  No contributory findings. Hepatobiliary: No focal liver abnormality.No evidence of biliary obstruction or stone. Pancreas: Unremarkable. Spleen: Unremarkable. Adrenals/Urinary Tract: Negative adrenals. No hydronephrosis or stone. Tiny cystic density in the right kidney. Unremarkable bladder. Stomach/Bowel: Wall thickening of the appendix with stone and mesoappendiceal stranding. At the midportion the appendix measures up to 13 mm in diameter where the wall is somewhat irregular. No collection is seen. The appendix is in expected location inferior to the cecum. Vascular/Lymphatic: No acute vascular abnormality. No mass or adenopathy. Reproductive:No pathologic findings. Other: No ascites or pneumoperitoneum. Musculoskeletal: No acute abnormalities.  L5 limbus. These results were called by telephone at the time of interpretation on 07/10/2019 at 6:58 am to provider [facility] , who verbally acknowledged these results. IMPRESSION: Acute suppurative appendicitis. The wall is somewhat irregular at the midportion but no clear perforation and no abscess. An appendicolith is present. Electronically Signed   By: Marnee Spring M.D.   On: 07/10/2019 07:01    __________________________________________  Procedures   ____________________________________________   INITIAL IMPRESSION / MDM / ASSESSMENT AND PLAN / ED COURSE  As part of my medical decision making, I reviewed the following data within the electronic MEDICAL RECORD NUMBER  29 year old male presented with above-stated history and physical exam secondary to abdominal pain and constipation.  Given considerable right lower quadrant pain with palpation concern for possible appendicitis and as such CT scan of the abdomen pelvis performed which was consistent with acute appendicitis without evidence of perforation per Dr. Grace Isaac radiologist..  Patient given IV Zosyn 3.375 g.  Patient also discussed with Dr. Lady Gary  general surgery.  ____________________________________________  FINAL CLINICAL IMPRESSION(S) / ED DIAGNOSES  Final diagnoses:  Acute appendicitis with localized peritonitis, without perforation, abscess, or gangrene     MEDICATIONS GIVEN DURING THIS VISIT:  Medications  piperacillin-tazobactam (ZOSYN) IVPB 3.375 g (has no administration in time range)  iohexol (OMNIPAQUE) 300 MG/ML solution 100 mL (100 mLs Intravenous Contrast Given 07/10/19 9678)     ED Discharge Orders    None      *Please note:  ABBIE JABLON was evaluated in Emergency Department on 07/10/2019 for the symptoms described in the history of present illness. He was evaluated in the context of the global COVID-19 pandemic, which necessitated consideration that the patient might be at risk for infection with the SARS-CoV-2 virus that causes COVID-19. Institutional protocols and algorithms that pertain to the evaluation of patients at risk for COVID-19 are in a state of rapid change based on information released by regulatory bodies including the CDC and federal and state organizations. These policies and algorithms were followed during the patient's care in the ED.  Some ED evaluations and interventions may be delayed as a result of limited staffing during the pandemic.*  Note:  This document was prepared using Dragon voice recognition software and may include unintentional dictation errors.   Darci Current, MD 07/10/19 [phone]

## 2019-07-10 NOTE — ED Triage Notes (Signed)
Patient ambulatory to triage with steady gait, without difficulty or distress noted, mask in place; pt reports mid lower abd pain x 3-4 days accomp by constipation (st unsure of last BM)

## 2019-07-10 NOTE — Anesthesia Preprocedure Evaluation (Addendum)
Anesthesia Evaluation  Patient identified by MRN, date of birth, ID band Patient awake    Reviewed: Allergy & Precautions, NPO status , Patient's Chart, lab work & pertinent test results  History of Anesthesia Complications Negative for: history of anesthetic complications  Airway Mallampati: II  TM Distance: >3 FB Neck ROM: Full    Dental no notable dental hx. (+) Teeth Intact, Dental Advisory Given   Pulmonary neg pulmonary ROS, neg sleep apnea, neg COPD, Patient abstained from smoking.Not current smoker,    Pulmonary exam normal breath sounds clear to auscultation       Cardiovascular Exercise Tolerance: Good METS(-) hypertension(-) CAD and (-) Past MI negative cardio ROS  (-) dysrhythmias  Rhythm:Regular Rate:Normal - Systolic murmurs    Neuro/Psych Depression negative neurological ROS     GI/Hepatic neg GERD  ,(+)     (-) substance abuse  ,   Endo/Other  neg diabetes  Renal/GU negative Renal ROS     Musculoskeletal   Abdominal   Peds  Hematology   Anesthesia Other Findings History reviewed. No pertinent past medical history.  Reproductive/Obstetrics                             Anesthesia Physical Anesthesia Plan  ASA: I  Anesthesia Plan: General   Post-op Pain Management:    Induction: Intravenous  PONV Risk Score and Plan: 3 and Ondansetron, Dexamethasone and Midazolam  Airway Management Planned: Oral ETT  Additional Equipment: None  Intra-op Plan:   Post-operative Plan: Extubation in OR  Informed Consent: I have reviewed the patients History and Physical, chart, labs and discussed the procedure including the risks, benefits and alternatives for the proposed anesthesia with the patient or authorized representative who has indicated his/her understanding and acceptance.     Dental advisory given  Plan Discussed with: CRNA and Surgeon  Anesthesia Plan Comments:  (Discussed risks of anesthesia with patient, including PONV, sore throat, lip/dental damage. Rare risks discussed as well, such as cardiorespiratory sequelae. Patient understands.  Patient denies nausea or vomiting during this hospitalization. His only symptom of appendicitis was abdominal pain.)       Anesthesia Quick Evaluation

## 2019-07-10 NOTE — H&P (Addendum)
[facility] SURGICAL HISTORY & PHYSICAL (cpt [phone])  HISTORY OF PRESENT ILLNESS (HPI):  29 y.o. male presented to [facility] ED overnight for abdominal pain. Patient reports ~3 to 4 day history of RLQ abdominal pain. He described the pain as a sharp and crampy pain. Nothing seemed to make the pain better and has been constant since the onset. Palpation exacerbates the pain. He has not tried any medications for this. He does not associated constipation over the last 3 days and dysuria. No fever, chills, cough, CP, SOB, nausea, emesis, or hematuria. Never had symptoms similar to this in the past. No previous abdominal surgeries. Work up in the ED concerning for acute uncomplicated appendicitis.   General surgery is consulted by emergency medicine physician Dr Bayard Males, MD for evaluation and management of acute appendicitis.    PAST MEDICAL HISTORY (PMH):  History reviewed. No pertinent past medical history.  Reviewed. Otherwise negative.   PAST SURGICAL HISTORY (PSH):  History reviewed. No pertinent surgical history.  Reviewed. Otherwise negative.   MEDICATIONS:  Prior to Admission medications   Not on File     ALLERGIES:  No Known Allergies   SOCIAL HISTORY:  Social History   Socioeconomic History   Marital status: Married    Spouse name: Not on file   Number of children: Not on file   Years of education: Not on file   Highest education level: Not on file  Occupational History   Not on file  Tobacco Use   Smoking status: Never Smoker   Smokeless tobacco: Never Used  Substance and Sexual Activity   Alcohol use: Not on file   Drug use: Not on file   Sexual activity: Not on file  Other Topics Concern   Not on file  Social History Narrative   Not on file   Social Determinants of Health   Financial Resource Strain:    Difficulty of Paying Living Expenses: Not on file  Food Insecurity:    Worried About Running Out of Food in the Last Year: Not on file   Ran Out of Food in the Last Year: Not on file  Transportation Needs:    Lack of Transportation (Medical): Not on file   Lack of Transportation (Non-Medical): Not on file  Physical Activity:    Days of Exercise per Week: Not on file   Minutes of Exercise per Session: Not on file  Stress:    Feeling of Stress : Not on file  Social Connections:    Frequency of Communication with Friends and Family: Not on file   Frequency of Social Gatherings with Friends and Family: Not on file   Attends Religious Services: Not on file   Active Member of Clubs or Organizations: Not on file   Attends Banker Meetings: Not on file   Marital Status: Not on file  Intimate Partner Violence:    Fear of Current or Ex-Partner: Not on file   Emotionally Abused: Not on file   Physically Abused: Not on file   Sexually Abused: Not on file     FAMILY HISTORY:  No family history on file.  Otherwise negative.   REVIEW OF SYSTEMS:  Review of Systems  Constitutional: Negative for chills and fever.  HENT: Negative for congestion and sore throat.   Respiratory: Negative for cough and wheezing.   Cardiovascular: Negative for chest pain and palpitations.  Gastrointestinal: Positive for abdominal pain and constipation. Negative for diarrhea, nausea and vomiting.  Genitourinary: Positive for  dysuria. Negative for frequency and hematuria.  Musculoskeletal: Negative for myalgias and neck pain.  All other systems reviewed and are negative.   VITAL SIGNS:  Temp:  [97.9 F (36.6 C)] 97.9 F (36.6 C) (02/04 0532) Pulse Rate:  [84-90] 84 (02/04 0600) Resp:  [16-18] 16 (02/04 0600) BP: (127-128)/(82-89) 128/89 (02/04 0600) SpO2:  [98 %-99 %] 99 % (02/04 0600) Weight:  [72.6 kg] 72.6 kg (02/04 0532)     Height: 5\' 6"  (167.6 cm) Weight: 72.6 kg BMI (Calculated): 25.84   PHYSICAL EXAM:  Physical Exam Vitals and nursing note reviewed.  Constitutional:      General: He is not in acute distress.     Appearance: He is well-developed and normal weight. He is not ill-appearing.  HENT:     Head: Normocephalic and atraumatic.  Eyes:     General: No scleral icterus.    Extraocular Movements: Extraocular movements intact.  Cardiovascular:     Rate and Rhythm: Normal rate and regular rhythm.     Heart sounds: Normal heart sounds. No murmur. No friction rub. No gallop.   Pulmonary:     Effort: Pulmonary effort is normal. No respiratory distress.     Breath sounds: Normal breath sounds. No wheezing.  Abdominal:     General: Abdomen is flat. There is no distension.     Palpations: Abdomen is soft.     Tenderness: There is abdominal tenderness in the right lower quadrant. There is no guarding or rebound. Positive signs include Rovsing's sign and McBurney's sign. Negative signs include Murphy's sign.     Hernia: No hernia is present.  Genitourinary:    Comments: Deferred Skin:    General: Skin is warm and dry.  Neurological:     General: No focal deficit present.     Mental Status: He is alert and oriented to person, place, and time.  Psychiatric:        Mood and Affect: Mood normal.        Behavior: Behavior normal.     INTAKE/OUTPUT:  This shift: No intake/output data recorded.  Last 2 shifts: @IOLAST2SHIFTS @  Labs:  CBC Latest Ref Rng & Units 07/10/2019  WBC 4.0 - 10.5 K/uL 10.5  Hemoglobin 13.0 - 17.0 g/dL  Hematocrit 09/07/2019 - 52.0 % 43.7  Platelets 150 - 400 K/uL 275   CMP Latest Ref Rng & Units 07/10/2019  Glucose 70 - 99 mg/dL 88.9)  BUN 6 - 20 mg/dL 17  Creatinine 09/07/2019 - 169(I mg/dL 5.03  Sodium 8.88 - 2.80 mmol/L 140  Potassium 3.5 - 5.1 mmol/L 3.3(L)  Chloride 98 - 111 mmol/L 104  CO2 22 - 32 mmol/L 27  Calcium 8.9 - 10.3 mg/dL 9.1  Total Protein 6.5 - 8.1 g/dL 8.0  Total Bilirubin 0.3 - 1.2 mg/dL 0.5  Alkaline Phos 38 - 126 U/L 49  AST 15 - 41 U/L 17  ALT 0 - 44 U/L 13     Imaging studies:   CT Abdomen/Pelvis (07/10/2019) personally reviewed which does  appear consistent with acute appendicitis, no abscess, and there does appear to be stool burden throughout the colon, and radiologist report reviewed:  IMPRESSION: Acute suppurative appendicitis. The wall is somewhat irregular at the midportion but no clear perforation and no abscess. An appendicolith is present.   Assessment/Plan: (ICD-10's: K35.80) 29 y.o. male with RLQ abdominal pain concerning for acute uncomplicated appendicitis, complicated by constipation x 3-4 days.    - Admit to general surgery  - NPO  -  IV Abx (Zosyn)   - IVF Resuscitation (NS + KCL)   - Monitor abdominal examination  - pain control prn; antiemetics prn  - Plan for laparoscopic appendectomy with Dr Celine Ahr later today pending OR/Anethesia availability  - All risks, benefits, and alternatives to above procedure(s) were discussed with the patient, all of his questions were answered to his expressed satisfaction, patient expresses he wishes to proceed, and informed consent was obtained.  - Will start bowel regimen for constipation: Colace + Miralax  - DVT prophylaxis; Hold for OR  All of the above findings and recommendations were discussed with the patient, and all of his questions were answered to his expressed satisfaction.  -- Edison Simon, PA-C [facility] 07/10/2019, 7:20 AM [phone] M-F: 7am - 4pm  I saw and evaluated the patient.  I agree with the above documentation, exam, and plan, which I have edited where appropriate. Fredirick Maudlin  2:31 PM

## 2019-07-10 NOTE — ED Notes (Addendum)
ED TO INPATIENT HANDOFF REPORT  ED Nurse Name and Phone #: Geraldine Contras [phone]  S Name/Age/Gender Ralph Malone 29 y.o. male Room/Bed: ED14A/ED14A  Code Status   Code Status: Full Code  Home/SNF/Other Home Patient oriented to: self, place, time and situation Is this baseline? Yes   Triage Complete: Triage complete  Chief Complaint Acute appendicitis [K35.80]  Triage Note Patient ambulatory to triage with steady gait, without difficulty or distress noted, mask in place; pt reports mid lower abd pain x 3-4 days accomp by constipation (st unsure of last BM)    Allergies No Known Allergies  Level of Care/Admitting Diagnosis ED Disposition    ED Disposition Condition Comment   Admit  Hospital Area: [facility] [[id number]]  Level of Care: Med-Surg [16]  Covid Evaluation: Asymptomatic Screening Protocol (No Symptoms)  Diagnosis: Acute appendicitis [778242]  Admitting Physician: Shana Chute  Attending Physician: Shana Chute       B Medical/Surgery History History reviewed. No pertinent past medical history. History reviewed. No pertinent surgical history.   A IV Location/Drains/Wounds Patient Lines/Drains/Airways Status   Active Line/Drains/Airways    Name:   Placement date:   Placement time:   Site:   Days:   Peripheral IV 07/10/19 Left Antecubital   07/10/19    0540    Antecubital   less than 1          Intake/Output Last 24 hours  Intake/Output Summary (Last 24 hours) at 07/10/2019 1102 Last data filed at 07/10/2019 3536 Gross per 24 hour  Intake 50 ml  Output --  Net 50 ml    Labs/Imaging Results for orders placed or performed during the hospital encounter of 07/10/19 (from the past 48 hour(s))  Lipase, blood     Status: None   Collection Time: 07/10/19  5:41 AM  Result Value Ref Range   Lipase 25 11 - 51 U/L    Comment: Performed at [facility], [address] [place], [place] [address]  Comprehensive  metabolic panel     Status: Abnormal   Collection Time: 07/10/19  5:41 AM  Result Value Ref Range   Sodium 140 135 - 145 mmol/L   Potassium 3.3 (L) 3.5 - 5.1 mmol/L   Chloride 104 98 - 111 mmol/L   CO2 27 22 - 32 mmol/L   Glucose, Bld 101 (H) 70 - 99 mg/dL   BUN 17 6 - 20 mg/dL   Creatinine, Ser 5.40 0.61 - 1.24 mg/dL   Calcium 9.1 8.9 - 08.6 mg/dL   Total Protein 8.0 6.5 - 8.1 g/dL   Albumin 4.4 3.5 - 5.0 g/dL   AST 17 15 - 41 U/L   ALT 13 0 - 44 U/L   Alkaline Phosphatase 49 38 - 126 U/L   Total Bilirubin 0.5 0.3 - 1.2 mg/dL   GFR calc non Af Amer >60 >60 mL/min   GFR calc Af Amer >60 >60 mL/min   Anion gap 9 5 - 15    Comment: Performed at [facility], [address] [place], [place] [address]  CBC     Status: None   Collection Time: 07/10/19  5:41 AM  Result Value Ref Range   WBC 10.5 4.0 - 10.5 K/uL   RBC 5.18 4.22 - 5.81 MIL/uL   Hemoglobin 15.2 13.0 - 17.0 g/dL   HCT 09.3 26.7 - 12.4 %   MCV 84.4 80.0 - 100.0 fL   MCH 29.3 26.0 - 34.0 pg  MCHC 34.8 30.0 - 36.0 g/dL   RDW 12.0 11.5 - 15.5 %   Platelets 275 150 - 400 K/uL   nRBC 0.0 0.0 - 0.2 %    Comment: Performed at [facility], [address] [place], [place] [address]  Respiratory Panel by RT PCR (Flu A&B, Covid) - Nasopharyngeal Swab     Status: None   Collection Time: 07/10/19  8:12 AM   Specimen: Nasopharyngeal Swab  Result Value Ref Range   SARS Coronavirus 2 by RT PCR NEGATIVE NEGATIVE    Comment: (NOTE) SARS-CoV-2 target nucleic acids are NOT DETECTED. The SARS-CoV-2 RNA is generally detectable in upper respiratoy specimens during the acute phase of infection. The lowest concentration of SARS-CoV-2 viral copies this assay can detect is 131 copies/mL. A negative result does not preclude SARS-Cov-2 infection and should not be used as the sole basis for treatment or other patient management decisions. A negative result may occur with  improper specimen collection/handling,  submission of specimen other than nasopharyngeal swab, presence of viral mutation(s) within the areas targeted by this assay, and inadequate number of viral copies (<131 copies/mL). A negative result must be combined with clinical observations, patient history, and epidemiological information. The expected result is Negative. Fact Sheet for Patients:  PinkCheek.be Fact Sheet for Healthcare Providers:  GravelBags.it This test is not yet ap proved or cleared by the [place] FDA and  has been authorized for detection and/or diagnosis of SARS-CoV-2 by FDA under an Emergency Use Authorization (EUA). This EUA will remain  in effect (meaning this test can be used) for the duration of the COVID-19 declaration under Section 564(b)(1) of the Act, 21 U.S.C. section 360bbb-3(b)(1), unless the authorization is terminated or revoked sooner.    Influenza A by PCR NEGATIVE NEGATIVE   Influenza B by PCR NEGATIVE NEGATIVE    Comment: (NOTE) The Xpert Xpress SARS-CoV-2/FLU/RSV assay is intended as an aid in  the diagnosis of influenza from Nasopharyngeal swab specimens and  should not be used as a sole basis for treatment. Nasal washings and  aspirates are unacceptable for Xpert Xpress SARS-CoV-2/FLU/RSV  testing. Fact Sheet for Patients: PinkCheek.be Fact Sheet for Healthcare Providers: GravelBags.it This test is not yet approved or cleared by the [place] FDA and  has been authorized for detection and/or diagnosis of SARS-CoV-2 by  FDA under an Emergency Use Authorization (EUA). This EUA will remain  in effect (meaning this test can be used) for the duration of the  Covid-19 declaration under Section 564(b)(1) of the Act, 21  U.S.C. section 360bbb-3(b)(1), unless the authorization is  terminated or revoked. Performed at [facility], [address]  [place], [place] [address]   Urinalysis, Complete w Microscopic     Status: Abnormal   Collection Time: 07/10/19  8:30 AM  Result Value Ref Range   Color, Urine STRAW (A) YELLOW   APPearance CLEAR (A) CLEAR   Specific Gravity, Urine 1.031 (H) 1.005 - 1.030   pH 6.0 5.0 - 8.0   Glucose, UA NEGATIVE NEGATIVE mg/dL   Hgb urine dipstick NEGATIVE NEGATIVE   Bilirubin Urine NEGATIVE NEGATIVE   Ketones, ur NEGATIVE NEGATIVE mg/dL   Protein, ur NEGATIVE NEGATIVE mg/dL   Nitrite NEGATIVE NEGATIVE   Leukocytes,Ua NEGATIVE NEGATIVE   WBC, UA NONE SEEN 0 - 5 WBC/hpf   Bacteria, UA NONE SEEN NONE SEEN   Squamous Epithelial / LPF NONE SEEN 0 - 5    Comment: Performed at [facility], [address] [place], [place] [address]   CT ABDOMEN PELVIS W CONTRAST  Result Date: 07/10/2019 CLINICAL DATA:  Lower abdominal pain with constipation for 3-4 days. EXAM: CT ABDOMEN AND PELVIS WITH CONTRAST TECHNIQUE: Multidetector CT imaging of the abdomen and pelvis was performed using the standard protocol following bolus administration of intravenous contrast. CONTRAST:  OMNIPAQUE IOHEXOL 300 MG/ML  SOLN COMPARISON:  None. FINDINGS: Lower chest:  No contributory findings. Hepatobiliary: No focal liver abnormality.No evidence of biliary obstruction or stone. Pancreas: Unremarkable. Spleen: Unremarkable. Adrenals/Urinary Tract: Negative adrenals. No hydronephrosis or stone. Tiny cystic density in the right kidney. Unremarkable bladder. Stomach/Bowel: Wall thickening of the appendix with stone and mesoappendiceal stranding. At the midportion the appendix measures up to 13 mm in diameter where the wall is somewhat irregular. No collection is seen. The appendix is in expected location inferior to the cecum. Vascular/Lymphatic: No acute vascular abnormality. No mass or adenopathy. Reproductive:No pathologic findings. Other: No ascites or pneumoperitoneum. Musculoskeletal: No acute abnormalities.  L5 limbus. These  results were called by telephone at the time of interpretation on 07/10/2019 at 6:58 am to provider [facility] , who verbally acknowledged these results. IMPRESSION: Acute suppurative appendicitis. The wall is somewhat irregular at the midportion but no clear perforation and no abscess. An appendicolith is present. Electronically Signed   By: Marnee Spring M.D.   On: 07/10/2019 07:01    Pending Labs Unresulted Labs (From admission, onward)    Start     Ordered   07/11/19 0500  Basic metabolic panel  Tomorrow morning,   STAT     07/10/19 0725   07/11/19 0500  CBC  Tomorrow morning,   STAT     07/10/19 0725   07/10/19 0726  HIV Antibody (routine testing w rflx)  (HIV Antibody (Routine testing w reflex) panel)  Once,   STAT     07/10/19 0725          Vitals/Pain Today's Vitals   07/10/19 0600 07/10/19 0830 07/10/19 0900 07/10/19 1000  BP: 128/89 (!) 138/92 122/74 (!) 113/59  Pulse: 84 77 74 73  Resp: 16     Temp:      TempSrc:      SpO2: 99% 99% 99% 99%  Weight:      Height:      PainSc:        Isolation Precautions No active isolations  Medications Medications  0.9 % NaCl with KCl 20 mEq/ L  infusion ( Intravenous New Bag/Given 07/10/19 0829)  piperacillin-tazobactam (ZOSYN) IVPB 3.375 g (has no administration in time range)  morphine 2 MG/ML injection 2-4 mg (has no administration in time range)  ondansetron (ZOFRAN-ODT) disintegrating tablet 4 mg (has no administration in time range)    Or  ondansetron (ZOFRAN) injection 4 mg (has no administration in time range)  docusate sodium (COLACE) capsule 100 mg (has no administration in time range)  polyethylene glycol (MIRALAX / GLYCOLAX) packet 17 g (0 g Oral Hold 07/10/19 0903)  iohexol (OMNIPAQUE) 300 MG/ML solution 100 mL (100 mLs Intravenous Contrast Given 07/10/19 0628)  piperacillin-tazobactam (ZOSYN) IVPB 3.375 g (0 g Intravenous Stopped 07/10/19 0829)    Mobility walks  Low fall risk   Focused  Assessments    R Recommendations: See Admitting Provider Note  Report given to: Zollie Scale, RN

## 2019-07-11 LAB — BASIC METABOLIC PANEL
Anion gap: 7 (ref 5–15)
BUN: 11 mg/dL (ref 6–20)
CO2: 28 mmol/L (ref 22–32)
Calcium: 8.8 mg/dL — ABNORMAL LOW (ref 8.9–10.3)
Chloride: 103 mmol/L (ref 98–111)
Creatinine, Ser: 0.85 mg/dL (ref 0.61–1.24)
GFR calc Af Amer: >60 mL/min (ref >60)
GFR calc non Af Amer: >60 mL/min (ref >60)
Glucose, Bld: 112 mg/dL — ABNORMAL HIGH (ref 70–99)
Potassium: 3.9 mmol/L (ref 3.5–5.1)
Sodium: 138 mmol/L (ref 135–145)

## 2019-07-11 LAB — CBC
HCT: 39.2 % (ref 39.0–52.0)
Hemoglobin: 13.7 g/dL (ref 13.0–17.0)
MCH: 29.5 pg (ref 26.0–34.0)
MCHC: 34.9 g/dL (ref 30.0–36.0)
MCV: 84.5 fL (ref 80.0–100.0)
Platelets: 260 10*3/uL (ref 150–400)
RBC: 4.64 MIL/uL (ref 4.22–5.81)
RDW: 12.1 % (ref 11.5–15.5)
WBC: 12.4 10*3/uL — ABNORMAL HIGH (ref 4.0–10.5)
nRBC: 0 % (ref 0.0–0.2)

## 2019-07-11 MED ORDER — IBUPROFEN 600 MG PO TABS: 600.0000 mg | ORAL_TABLET | ORAL | 0 refills | Status: DC | PRN

## 2019-07-11 MED ORDER — POLYETHYLENE GLYCOL 3350 17 G PO PACK: 17.0000 g | PACK | Freq: Every day | ORAL | 0 refills | Status: DC

## 2019-07-11 MED ORDER — DOCUSATE SODIUM 100 MG PO CAPS: 100.0000 mg | ORAL_CAPSULE | Freq: Every day | ORAL | 0 refills | Status: DC | PRN

## 2019-07-11 MED ORDER — HYDROCODONE-ACETAMINOPHEN 5-325 MG PO TABS: 1.0000 | ORAL_TABLET | Freq: Four times a day (QID) | ORAL | 0 refills | Status: DC | PRN

## 2019-07-11 NOTE — Progress Notes (Signed)
Discharge Summary  Patient ID: Ralph Malone MRN: [id number] DOB/AGE: 1991-05-27 28 y.o.  Admit date: 07/10/2019 DX: Acute appendicitis [K35.80] Acute appendicitis with localized peritonitis, without perforation, abscess, or gangrene [K35.30]   Discharge date: 07/11/2019 Method of transport: Discharge address: [address] [place] [address]   Discharge Exam: Blood pressure (!) 107/53, pulse 63, temperature (!) 97.4 F (36.3 C), temperature source Oral, resp. rate 16, height 5\' 6"  (1.676 m), weight 72.6 kg, SpO2 98 %. IV removed. Belongings gathered. AVS was provided to the patient which included instructions that addressed activity level, diet, discharge medications, follow-up appointments, and what to do if symptoms worsen. All questions answered for patient/caregiver clarification.   Follow-up Information    , PA-C. Go on 07/25/2019.   Specialty: Physician Assistant Why: s/p lap appy 10am appointment Contact information: [address] [place] [phone]           Signed: [phone] 07/11/2019, 12:23 PM

## 2019-07-11 NOTE — Discharge Instructions (Signed)
In addition to included general post-operative instructions for laparoscopic appendectomy,  Diet: Resume home diet.   Activity: No heavy lifting >20 pounds (children, pets, laundry, garbage) for about 4 weeks, but light activity and walking are encouraged. Do not drive or drink alcohol if taking narcotic pain medications or having pain that might distract from driving.  Wound care: 2 days after surgery (02/06), you may shower/get incision wet with soapy water and pat dry (do not rub incisions), but no baths or submerging incision underwater until follow-up.   Medications: Resume all home medications. For mild to moderate pain: acetaminophen (Tylenol) or ibuprofen/naproxen (if no kidney disease). Combining Tylenol with alcohol can substantially increase your risk of causing liver disease. Narcotic pain medications, if prescribed, can be used for severe pain, though may cause nausea, constipation, and drowsiness. Do not combine Tylenol and Percocet (or similar) within a 6 hour period as Percocet (and similar) contain(s) Tylenol. If you do not need the narcotic pain medication, you do not need to fill the prescription.  Call office [phone] / [phone]) at any time if any questions, worsening pain, fevers/chills, bleeding, drainage from incision site, or other concerns.

## 2019-07-11 NOTE — Discharge Summary (Addendum)
[facility] SURGICAL DISCHARGE SUMMARY  Patient ID: Ralph Malone MRN: [id number] DOB/AGE: 1990-08-27 29 y.o.  Admit date: 07/10/2019 Discharge date: 07/11/2019  Discharge Diagnoses Patient Active Problem List   Diagnosis Date Noted   Acute appendicitis 07/10/2019    Consultants None  Procedures 07/10/2019:  Laparoscopic appendectomy  HPI: 29 y.o. male presented to [facility] ED overnight for abdominal pain. Patient reports ~3 to 4 day history of RLQ abdominal pain. He described the pain as a sharp and crampy pain. Nothing seemed to make the pain better and has been constant since the onset. Palpation exacerbates the pain. He has not tried any medications for this. He does not associated constipation over the last 3 days and dysuria. No fever, chills, cough, CP, SOB, nausea, emesis, or hematuria. Never had symptoms similar to this in the past. No previous abdominal surgeries. Work up in the ED concerning for acute uncomplicated appendicitis.   Hospital Course: Informed consent was obtained and documented, and patient underwent uneventful laparoscopic appendectomy (Dr Lady Gary, 07/10/2019).  Post-operatively, patient's symptoms and pain improved/resolved and advancement of patient's diet and ambulation were well-tolerated. The remainder of patient's hospital course was essentially unremarkable, and discharge planning was initiated accordingly with patient safely able to be discharged home with appropriate discharge instructions, antibiotics (Augmentin x7 days), pain control, and outpatient follow-up after all of his questions were answered to his expressed satisfaction.   Discharge Condition: Good   Physical Examination:  Constitutional: Well appearing male, NAD Pulmonary: Normal effort, no respiratory distress Gastrointestinal: Soft, Incisional soreness, non-distended, no rebound/guarding Skin: Laparoscopic incisions are CDI with steri-strips, no erythema or  drainage   Allergies as of 07/11/2019   No Known Allergies      Medication List     TAKE these medications    cetirizine 10 MG tablet Commonly known as: ZYRTEC Take 10 mg by mouth daily as needed for allergies.   docusate sodium 100 MG capsule Commonly known as: COLACE Take 1 capsule (100 mg total) by mouth daily as needed for mild constipation.   HYDROcodone-acetaminophen 5-325 MG tablet Commonly known as: NORCO/VICODIN Take 1 tablet by mouth every 6 (six) hours as needed for severe pain.   ibuprofen 600 MG tablet Commonly known as: ADVIL Take 1 tablet (600 mg total) by mouth every 4 (four) hours as needed for moderate pain or cramping.   polyethylene glycol 17 g packet Commonly known as: MIRALAX / GLYCOLAX Take 17 g by mouth daily.   sertraline 50 MG tablet Commonly known as: ZOLOFT Take 50 mg by mouth daily.         Follow-up Information     Donovan Kail, PA-C. Schedule an appointment as soon as possible for a visit in 2 week(s).   Specialty: Physician Assistant Why: s/p lap appy Contact information: [address] [place] [address] [phone]             Time spent on discharge management including discussion of hospital course, clinical condition, outpatient instructions, prescriptions, and follow up with the patient and members of the medical team: >30 minutes  -- Lynden Oxford , PA-C [facility]  07/11/2019, 7:54 AM [phone] M-F: 7am - 4pm  I saw and evaluated the patient.  I agree with the above documentation, exam, and plan, which I have edited where appropriate. Duanne Guess  11:59 AM

## 2019-07-14 LAB — SURGICAL PATHOLOGY

## 2019-07-22 DIAGNOSIS — Z6825 Body mass index (BMI) 25.0-25.9, adult: Secondary | ICD-10-CM | POA: Diagnosis not present

## 2019-07-22 DIAGNOSIS — R42 Dizziness and giddiness: Secondary | ICD-10-CM | POA: Diagnosis not present

## 2019-07-22 DIAGNOSIS — F3341 Major depressive disorder, recurrent, in partial remission: Secondary | ICD-10-CM | POA: Diagnosis not present

## 2019-07-22 DIAGNOSIS — F411 Generalized anxiety disorder: Secondary | ICD-10-CM | POA: Diagnosis not present

## 2019-07-24 NOTE — Anesthesia Postprocedure Evaluation (Signed)
Anesthesia Post Note  Patient: Ralph Malone  Procedure(s) Performed: APPENDECTOMY LAPAROSCOPIC (N/A Abdomen)  Patient location during evaluation: PACU Anesthesia Type: General Level of consciousness: awake and alert and oriented Pain management: pain level controlled Vital Signs Assessment: post-procedure vital signs reviewed and stable Respiratory status: spontaneous breathing Cardiovascular status: blood pressure returned to baseline Anesthetic complications: no     Last Vitals:  Vitals:   07/10/19 2152 07/11/19 0503  BP: 117/69 (!) 107/53  Pulse: 75 63  Resp: 16 16  Temp: 36.8 C (!) 36.3 C  SpO2: 99% 98%    Last Pain:  Vitals:   07/11/19 0908  TempSrc:   PainSc: 2                  Marguis Mathieson

## 2019-07-25 ENCOUNTER — Telehealth (INDEPENDENT_AMBULATORY_CARE_PROVIDER_SITE_OTHER): Payer: BC Managed Care – PPO | Admitting: Physician Assistant

## 2019-07-25 ENCOUNTER — Other Ambulatory Visit: Payer: Self-pay

## 2019-07-25 DIAGNOSIS — K3580 Unspecified acute appendicitis: Secondary | ICD-10-CM

## 2019-07-25 DIAGNOSIS — Z09 Encounter for follow-up examination after completed treatment for conditions other than malignant neoplasm: Secondary | ICD-10-CM

## 2019-07-25 NOTE — Progress Notes (Signed)
[facility] SURGICAL OFFICE VISIT - TELEPHONE  Referring provider:  [facility] @ [facility] [address] [place],  [place] [address]   Virtual Visit via Telemedicine Note I connected with Ralph Malone by telephone at his home on 07/25/19 at 10:00 AM EST and verified that I was speaking with the correct person using their name and two idenfiers/date of birth.   I discussed the limitations, risks, security and privacy concerns of performing an evaluation and management service by telephonic and the availability of in person appointments. The patient expressed understanding and agreed to proceed.   History of Present Illness: Ralph Malone is a 29 y.o. 15 days s/p laparoscopic appendectomy with Dr Lady Gary.   He reports that he has been doing well. No issues with pain and no longer requiring pain medications. No fever, chills, nausea, or emesis. He is tolerating PO and having good bowel function. He has returned to work and is following lifting restrictions. No issues with incisions reported, no redness, no drainage. Steri-strips have fallen off.    Physical Examination:  Constitutional: Speaks in full sentences, no apparent distress.    Assessment/Plan:  @NAME  is a 30 y.o. 15 days s/p laparoscopic appendectomy   - No issues reported  - pain control prn  - reviewed wound care  - Continue lifting restrictions x2 more weeks  - reviewed pathology report  - follow up prn; advised to call with questions or concerns   From [facility] outpatient surgery office, I provided 10 minutes of non-face-to-face time during this encounter.  -- 36, PA-C [facility] 07/25/2019, 9:54 AM [phone] M-F: 7am - 4pm

## 2019-08-06 ENCOUNTER — Telehealth: Payer: Self-pay | Admitting: Adult Health

## 2019-08-07 NOTE — Telephone Encounter (Signed)
ERROR

## 2019-08-11 ENCOUNTER — Ambulatory Visit (INDEPENDENT_AMBULATORY_CARE_PROVIDER_SITE_OTHER): Payer: BC Managed Care – PPO | Admitting: Adult Health

## 2019-08-11 ENCOUNTER — Other Ambulatory Visit: Payer: Self-pay

## 2019-08-11 ENCOUNTER — Encounter: Payer: Self-pay | Admitting: Adult Health

## 2019-08-11 VITALS — BP 121/76 | HR 77 | Ht 66.0 in | Wt 160.0 lb

## 2019-08-11 DIAGNOSIS — F331 Major depressive disorder, recurrent, moderate: Secondary | ICD-10-CM

## 2019-08-11 DIAGNOSIS — G4700 Insomnia, unspecified: Secondary | ICD-10-CM

## 2019-08-11 DIAGNOSIS — F411 Generalized anxiety disorder: Secondary | ICD-10-CM

## 2019-08-11 NOTE — Progress Notes (Signed)
[facility] MD/PA/NP Initial Note  08/11/2019 10:39 AM Ralph Malone  MRN:  [id number]  Chief Complaint:  Chief Complaint    Anxiety; Depression; Insomnia      HPI:   Describes mood today as "ok". Pleasant. Mood symptoms - reports depression, anxiety, and irritability. Stating "it's been going on for years". Feels like his "mind races all the time". Worries about about "everything". Symptoms worsened over past 2 to 3 years. Has taken Zoloft in the past. Stating "I think Zoloft did seem helpful initially". Stopped it after having a surgery and never restarted it. Doesn't won't to be trialed on medications and would like the genetic testing done before he takes any other psychotropic medication. Stable interest and motivation. Taking medications as prescribed.  Energy levels stable. Active, does not have a regular exercise routine. Works full-time. Enjoys some usual interests and activities. Married. Lives with wife of 7 years and 2 children - d-5 and s-4. Hunting and scouting. No family local. Spending time with family.  Appetite adequate. Weight stable. Sleeps well most nights. Averages 3 to 4 hours for a "long time". Can't shut his mind off.  Focus and concentration difficulties "occasionally". Works full time as a Administrator. Completing tasks. Managing aspects of household.  Denies SI or HI. Denies AH or VH.  Previous medication trials: Zoloft   Visit Diagnosis:    ICD-10-CM   1. Major depressive disorder, recurrent episode, moderate (HCC)  F33.1   2. Generalized anxiety disorder  F41.1   3. Insomnia, unspecified type  G47.00     Past Psychiatric History: Denies psychiatric hospitalization.  Past Medical History: History reviewed. No pertinent past medical history.  Past Surgical History:  Procedure Laterality Date  . LAPAROSCOPIC APPENDECTOMY N/A 07/10/2019   Procedure: APPENDECTOMY LAPAROSCOPIC;  Surgeon: Fredirick Maudlin, MD;  Location: [facility] ORS;  Service: General;  Laterality:  N/A;    Family Psychiatric History: Mother has anxiety issues - on Zoloft.   Family History: History reviewed. No pertinent family history.  Social History:  Social History   Socioeconomic History  . Marital status: Married    Spouse name: Not on file  . Number of children: Not on file  . Years of education: Not on file  . Highest education level: Not on file  Occupational History  . Not on file  Tobacco Use  . Smoking status: Never Smoker  . Smokeless tobacco: Never Used  Substance and Sexual Activity  . Alcohol use: Yes    Comment: social   . Drug use: Never  . Sexual activity: Not on file  Other Topics Concern  . Not on file  Social History Narrative  . Not on file   Social Determinants of Health   Financial Resource Strain:   . Difficulty of Paying Living Expenses: Not on file  Food Insecurity:   . Worried About Charity fundraiser in the Last Year: Not on file  . Ran Out of Food in the Last Year: Not on file  Transportation Needs:   . Lack of Transportation (Medical): Not on file  . Lack of Transportation (Non-Medical): Not on file  Physical Activity:   . Days of Exercise per Week: Not on file  . Minutes of Exercise per Session: Not on file  Stress:   . Feeling of Stress : Not on file  Social Connections:   . Frequency of Communication with Friends and Family: Not on file  . Frequency of Social Gatherings with Friends and Family: Not on  file  . Attends Religious Services: Not on file  . Active Member of Clubs or Organizations: Not on file  . Attends Banker Meetings: Not on file  . Marital Status: Not on file    Allergies: No Known Allergies  Metabolic Disorder Labs: No results found for: HGBA1C, MPG No results found for: PROLACTIN No results found for: CHOL, TRIG, HDL, CHOLHDL, VLDL, LDLCALC No results found for: TSH  Therapeutic Level Labs: No results found for: LITHIUM No results found for: VALPROATE No components found for:   CBMZ  Current Medications: Current Outpatient Medications  Medication Sig Dispense Refill  . cetirizine (ZYRTEC) 10 MG tablet Take 10 mg by mouth daily as needed for allergies.     No current facility-administered medications for this visit.    Medication Side Effects: none  Orders placed this visit:  No orders of the defined types were placed in this encounter.   Psychiatric Specialty Exam:  Review of Systems  Musculoskeletal: Negative for gait problem.  Neurological: Negative for tremors.  Psychiatric/Behavioral:       Please refer to HPI    Blood pressure 121/76, pulse 77, height 5\' 6"  (1.676 m), weight 160 lb (72.6 kg).Body mass index is 25.82 kg/m.  General Appearance: Neat and Well Groomed  Eye Contact:  Good  Speech:  Clear and Coherent and Normal Rate  Volume:  Normal  Mood:  Anxious  Affect:  Appropriate and Congruent  Thought Process:  Coherent  Orientation:  Full (Time, Place, and Person)  Thought Content: Logical   Suicidal Thoughts:  No  Homicidal Thoughts:  No  Memory:  WNL  Judgement:  Good  Insight:  Good  Psychomotor Activity:  Normal  Concentration:  Concentration: Good  Recall:  Good  Fund of Knowledge: Good  Language: Good  Assets:  Communication Skills Desire for Improvement Financial Resources/Insurance Housing Intimacy Leisure Time Physical Health Resilience Social Support Talents/Skills Transportation Vocational/Educational  ADL's:  Intact  Cognition: WNL  Prognosis:  Good   Screenings: None  Receiving Psychotherapy: No   Treatment Plan/Recommendations:   Plan:  PDMP reviewed  1. Discussed Gene Sight testing and information given to call insurance for approval. Will call if able to get test or needs further assistance.   RTC as needed or if symptoms worsen  Patient advised to contact office with any questions, adverse effects, or acute worsening in signs and symptoms.      , NP

## 2019-10-27 DIAGNOSIS — F411 Generalized anxiety disorder: Secondary | ICD-10-CM | POA: Diagnosis not present

## 2019-10-27 DIAGNOSIS — F3341 Major depressive disorder, recurrent, in partial remission: Secondary | ICD-10-CM | POA: Diagnosis not present

## 2020-01-14 ENCOUNTER — Telehealth: Payer: Self-pay | Admitting: Adult Health

## 2020-01-14 NOTE — Telephone Encounter (Signed)
error 

## 2020-01-27 ENCOUNTER — Ambulatory Visit: Payer: BC Managed Care – PPO | Admitting: Adult Health

## 2020-01-27 ENCOUNTER — Encounter (INDEPENDENT_AMBULATORY_CARE_PROVIDER_SITE_OTHER): Payer: Self-pay

## 2020-01-27 ENCOUNTER — Other Ambulatory Visit: Payer: Self-pay

## 2020-01-27 ENCOUNTER — Encounter: Payer: Self-pay | Admitting: Adult Health

## 2020-01-27 DIAGNOSIS — F411 Generalized anxiety disorder: Secondary | ICD-10-CM

## 2020-01-27 DIAGNOSIS — F331 Major depressive disorder, recurrent, moderate: Secondary | ICD-10-CM

## 2020-01-27 DIAGNOSIS — G4700 Insomnia, unspecified: Secondary | ICD-10-CM | POA: Diagnosis not present

## 2020-01-27 DIAGNOSIS — F332 Major depressive disorder, recurrent severe without psychotic features: Secondary | ICD-10-CM | POA: Diagnosis not present

## 2020-01-27 NOTE — Progress Notes (Signed)
BILLYE NYDAM [id number] Dec 05, 1990 29 y.o.  Subjective:   Patient ID:  Ralph Malone is a 29 y.o. (DOB Apr 20, 1991) male.  Chief Complaint: No chief complaint on file.   HPI RYLEIGH BUENGER presents to the office today for follow-up of MDD, GAD, and insomnia.  Describes mood today as "ok". Pleasant. Mood symptoms - reports depression, anxiety, and irritability. Stating "some days are worse than others". Continues to worry - "that never changes". Mind races - "I don't have an off switch". Would like to have the genetic testing completed before adding a new medication. Improved interest and motivation. Taking medications as prescribed.  Energy levels stable. Active, does not have a regular exercise routine. Has a physical job. Enjoys some usual interests and activities. Married. Lives with wife of 7 years and 2 children - d-6 and s-4.  No family local. Spending time with family. Stating "I am excited about hunting this fall". Appetite adequate. Weight stable - 160 pounds. Sleeps well most nights. Averages 3 to 4 hours. Stating "I have never slept well".  Focus and concentration difficulties "at times". 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 Completing tasks. Managing aspects of household.  Denies SI or HI. Denies AH or VH.  Previous medication trials: Zoloft  Review of Systems:  Review of Systems  Musculoskeletal: Negative for gait problem.  Neurological: Negative for tremors.  Psychiatric/Behavioral:       Please refer to HPI    Medications: I have reviewed the patient's current medications.  Current Outpatient Medications  Medication Sig Dispense Refill  . cetirizine (ZYRTEC) 10 MG tablet Take 10 mg by mouth daily as needed for allergies.     No current facility-administered medications for this visit.    Medication Side Effects: None  Allergies: No Known Allergies  No past medical history on file.  No family history on file.  Social History    Socioeconomic History  . Marital status: Married    Spouse name: Not on file  . Number of children: Not on file  . Years of education: Not on file  . Highest education level: Not on file  Occupational History  . Not on file  Tobacco Use  . Smoking status: Never Smoker  . Smokeless tobacco: Never Used  Vaping Use  . Vaping Use: Never used  Substance and Sexual Activity  . Alcohol use: Yes    Comment: social   . Drug use: Never  . Sexual activity: Not on file  Other Topics Concern  . Not on file  Social History Narrative  . Not on file   Social Determinants of Health   Financial Resource Strain:   . Difficulty of Paying Living Expenses: Not on file  Food Insecurity:   . Worried About Programme researcher, broadcasting/film/video in the Last Year: Not on file  . Ran Out of Food in the Last Year: Not on file  Transportation Needs:   . Lack of Transportation (Medical): Not on file  . Lack of Transportation (Non-Medical): Not on file  Physical Activity:   . Days of Exercise per Week: Not on file  . Minutes of Exercise per Session: Not on file  Stress:   . Feeling of Stress : Not on file  Social Connections:   . Frequency of Communication with Friends and Family: Not on file  . Frequency of Social Gatherings with Friends and Family: Not on file  . Attends Religious Services: Not on file  .  Active Member of Clubs or Organizations: Not on file  . Attends Banker Meetings: Not on file  . Marital Status: Not on file  Intimate Partner Violence:   . Fear of Current or Ex-Partner: Not on file  . Emotionally Abused: Not on file  . Physically Abused: Not on file  . Sexually Abused: Not on file    Past Medical History, Surgical history, Social history, and Family history were reviewed and updated as appropriate.   Please see review of systems for further details on the patient's review from today.   Objective:   Physical Exam:  There were no vitals taken for this visit.  Physical  Exam Constitutional:      General: He is not in acute distress. Musculoskeletal:        General: No deformity.  Neurological:     Mental Status: He is alert and oriented to person, place, and time.     Coordination: Coordination normal.  Psychiatric:        Attention and Perception: Attention and perception normal. He does not perceive auditory or visual hallucinations.        Mood and Affect: Mood normal. Mood is not anxious or depressed. Affect is not labile, blunt, angry or inappropriate.        Speech: Speech normal.        Behavior: Behavior normal.        Thought Content: Thought content normal. Thought content is not paranoid or delusional. Thought content does not include homicidal or suicidal ideation. Thought content does not include homicidal or suicidal plan.        Cognition and Memory: Cognition and memory normal.        Judgment: Judgment normal.     Comments: Insight intact     Lab Review:     Component Value Date/Time   NA 138 07/11/2019 0546   K 3.9 07/11/2019 0546   CL 103 07/11/2019 0546   CO2 28 07/11/2019 0546   GLUCOSE 112 (H) 07/11/2019 0546   BUN 11 07/11/2019 0546   CREATININE 0.85 07/11/2019 0546   CALCIUM 8.8 (L) 07/11/2019 0546   PROT 8.0 07/10/2019 0541   ALBUMIN 4.4 07/10/2019 0541   AST 17 07/10/2019 0541   ALT 13 07/10/2019 0541   ALKPHOS 49 07/10/2019 0541   BILITOT 0.5 07/10/2019 0541   GFRNONAA >60 07/11/2019 0546   GFRAA >60 07/11/2019 0546       Component Value Date/Time   WBC 12.4 (H) 07/11/2019 0546   RBC 4.64 07/11/2019 0546   HGB 13.7 07/11/2019 0546   HCT 39.2 07/11/2019 0546   PLT 260 07/11/2019 0546   MCV 84.5 07/11/2019 0546   MCH 29.5 07/11/2019 0546   MCHC 34.9 07/11/2019 0546   RDW 12.1 07/11/2019 0546    No results found for: POCLITH, LITHIUM   No results found for: PHENYTOIN, PHENOBARB, VALPROATE, CBMZ   .res Assessment: Plan:    Plan:  PDMP reviewed  Gene Sight testing - samples taken and sent for  processing.   RTC as needed or if symptoms worsen  Patient advised to contact office with any questions, adverse effects, or acute worsening in signs and symptoms.    Diagnoses and all orders for this visit:  Insomnia, unspecified type  Generalized anxiety disorder  Major depressive disorder, recurrent episode, moderate (HCC)     Please see After Visit Summary for patient specific instructions.  No future appointments.  No orders of the defined types were  placed in this encounter.   -------------------------------

## 2020-02-26 ENCOUNTER — Telehealth (INDEPENDENT_AMBULATORY_CARE_PROVIDER_SITE_OTHER): Payer: BC Managed Care – PPO | Admitting: Adult Health

## 2020-02-26 DIAGNOSIS — F411 Generalized anxiety disorder: Secondary | ICD-10-CM

## 2020-02-26 DIAGNOSIS — F331 Major depressive disorder, recurrent, moderate: Secondary | ICD-10-CM | POA: Diagnosis not present

## 2020-02-26 DIAGNOSIS — G4700 Insomnia, unspecified: Secondary | ICD-10-CM | POA: Diagnosis not present

## 2020-02-26 MED ORDER — VORTIOXETINE HBR 10 MG PO TABS: ORAL_TABLET | ORAL | 2 refills | Status: DC

## 2020-02-26 NOTE — Progress Notes (Signed)
MCADOO MUZQUIZ [id number] [birth year] 29 y.o.  Subjective:   Patient ID:  CID AGENA is a 29 y.o. (DOB March 28, 1991) male.  Chief Complaint: No chief complaint on file.   HPI LENUS TRAUGER presents to the office today for follow-up of MDD, GAD, and insomnia.  Describes mood today as "ok". Pleasant. Mood symptoms - reports depression, anxiety, and irritability. Stating "I have good and bad days". Feels like he worries a lot. Stating "my mind is always racing". Also stating "random stuff pops in my head". Difficulties with sleep - "waking up tired". Working long hours. Improved interest and motivation. Taking medications as prescribed.  Energy levels stable. Active, does not have a regular exercise routine.  Enjoys some usual interests and activities. Married. Lives with wife of 7 years and 2 children - d-6 and s-4.  No family local. Spending time with family.  Appetite adequate. Weight stable - 155 pounds. Sleeps well most nights. Averages 4 hours.  Focus and concentration difficulties "at times". 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 Managing aspects of household.  Denies SI or HI. Denies AH or VH.  Previous medication trials: Zoloft   Review of Systems:  Review of Systems  Musculoskeletal: Negative for gait problem.  Neurological: Negative for tremors.  Psychiatric/Behavioral:       Please refer to HPI    Medications: I have reviewed the patient's current medications.  Current Outpatient Medications  Medication Sig Dispense Refill  . cetirizine (ZYRTEC) 10 MG tablet Take 10 mg by mouth daily as needed for allergies.     No current facility-administered medications for this visit.    Medication Side Effects: None  Allergies: No Known Allergies  No past medical history on file.  No family history on file.  Social History   Socioeconomic History  . Marital status: Married    Spouse name: Not on file  . Number of  children: Not on file  . Years of education: Not on file  . Highest education level: Not on file  Occupational History  . Not on file  Tobacco Use  . Smoking status: Never Smoker  . Smokeless tobacco: Never Used  Vaping Use  . Vaping Use: Never used  Substance and Sexual Activity  . Alcohol use: Yes    Comment: social   . Drug use: Never  . Sexual activity: Not on file  Other Topics Concern  . Not on file  Social History Narrative  . Not on file   Social Determinants of Health   Financial Resource Strain:   . Difficulty of Paying Living Expenses: Not on file  Food Insecurity:   . Worried About Programme researcher, broadcasting/film/video in the Last Year: Not on file  . Ran Out of Food in the Last Year: Not on file  Transportation Needs:   . Lack of Transportation (Medical): Not on file  . Lack of Transportation (Non-Medical): Not on file  Physical Activity:   . Days of Exercise per Week: Not on file  . Minutes of Exercise per Session: Not on file  Stress:   . Feeling of Stress : Not on file  Social Connections:   . Frequency of Communication with Friends and Family: Not on file  . Frequency of Social Gatherings with Friends and Family: Not on file  . Attends Religious Services: Not on file  . Active Member of Clubs or Organizations: Not on file  . Attends  Club or Organization Meetings: Not on file  . Marital Status: Not on file  Intimate Partner Violence:   . Fear of Current or Ex-Partner: Not on file  . Emotionally Abused: Not on file  . Physically Abused: Not on file  . Sexually Abused: Not on file    Past Medical History, Surgical history, Social history, and Family history were reviewed and updated as appropriate.   Please see review of systems for further details on the patient's review from today.   Objective:   Physical Exam:  There were no vitals taken for this visit.  Physical Exam Constitutional:      General: He is not in acute distress. Musculoskeletal:         General: No deformity.  Neurological:     Mental Status: He is alert and oriented to person, place, and time.     Coordination: Coordination normal.  Psychiatric:        Attention and Perception: Attention and perception normal. He does not perceive auditory or visual hallucinations.        Mood and Affect: Mood normal. Mood is not anxious or depressed. Affect is not labile, blunt, angry or inappropriate.        Speech: Speech normal.        Behavior: Behavior normal.        Thought Content: Thought content normal. Thought content is not paranoid or delusional. Thought content does not include homicidal or suicidal ideation. Thought content does not include homicidal or suicidal plan.        Cognition and Memory: Cognition and memory normal.        Judgment: Judgment normal.     Comments: Insight intact     Lab Review:     Component Value Date/Time   NA 138 07/11/2019 0546   K 3.9 07/11/2019 0546   CL 103 07/11/2019 0546   CO2 28 07/11/2019 0546   GLUCOSE 112 (H) 07/11/2019 0546   BUN 11 07/11/2019 0546   CREATININE 0.85 07/11/2019 0546   CALCIUM 8.8 (L) 07/11/2019 0546   PROT 8.0 07/10/2019 0541   ALBUMIN 4.4 07/10/2019 0541   AST 17 07/10/2019 0541   ALT 13 07/10/2019 0541   ALKPHOS 49 07/10/2019 0541   BILITOT 0.5 07/10/2019 0541   GFRNONAA >60 07/11/2019 0546   GFRAA >60 07/11/2019 0546       Component Value Date/Time   WBC 12.4 (H) 07/11/2019 0546   RBC 4.64 07/11/2019 0546   HGB 13.7 07/11/2019 0546   HCT 39.2 07/11/2019 0546   PLT 260 07/11/2019 0546   MCV 84.5 07/11/2019 0546   MCH 29.5 07/11/2019 0546   MCHC 34.9 07/11/2019 0546   RDW 12.1 07/11/2019 0546    No results found for: POCLITH, LITHIUM   No results found for: PHENYTOIN, PHENOBARB, VALPROATE, CBMZ   .res Assessment: Plan:    Plan:  PDMP reviewed  Gene Sight testing completed - reviewed SSRI's that were indicated to use as directed. Patient has not received a copy of results.    Discussed Trintellix and will send in a script to try  1. Trintellix 10mg  - 1/2 tablet daily x 7 days, the 1 tablet daily  RTC as needed or if symptoms worsen  Patient advised to contact office with any questions, adverse effects, or acute worsening in signs and symptoms.   There are no diagnoses linked to this encounter.   Please see After Visit Summary for patient specific instructions.  No future appointments.  No orders of the defined types were placed in this encounter.   -------------------------------

## 2020-03-01 ENCOUNTER — Telehealth: Payer: Self-pay

## 2020-03-01 NOTE — Telephone Encounter (Signed)
Prior authorization initiated and approved for TRINTELLIX 10 MG effective 01/31/2020-03/03/2021 PA# [id number] [facility] ID# [id number]

## 2020-04-28 ENCOUNTER — Telehealth: Payer: Self-pay | Admitting: Adult Health

## 2020-04-28 NOTE — Telephone Encounter (Signed)
Not needed

## 2020-05-01 DIAGNOSIS — J069 Acute upper respiratory infection, unspecified: Secondary | ICD-10-CM | POA: Diagnosis not present

## 2020-05-01 DIAGNOSIS — Z20822 Contact with and (suspected) exposure to covid-19: Secondary | ICD-10-CM | POA: Diagnosis not present

## 2020-05-01 DIAGNOSIS — J029 Acute pharyngitis, unspecified: Secondary | ICD-10-CM | POA: Diagnosis not present

## 2020-05-04 ENCOUNTER — Telehealth (INDEPENDENT_AMBULATORY_CARE_PROVIDER_SITE_OTHER): Payer: BC Managed Care – PPO | Admitting: Adult Health

## 2020-05-04 ENCOUNTER — Encounter: Payer: Self-pay | Admitting: Adult Health

## 2020-05-04 DIAGNOSIS — F331 Major depressive disorder, recurrent, moderate: Secondary | ICD-10-CM | POA: Diagnosis not present

## 2020-05-04 DIAGNOSIS — G4700 Insomnia, unspecified: Secondary | ICD-10-CM

## 2020-05-04 DIAGNOSIS — F411 Generalized anxiety disorder: Secondary | ICD-10-CM

## 2020-05-04 MED ORDER — VORTIOXETINE HBR 20 MG PO TABS: ORAL_TABLET | ORAL | 2 refills | Status: DC

## 2020-05-04 NOTE — Progress Notes (Signed)
Ralph Malone [id number] 02/27/1991 29 y.o.  Virtual Visit via Telephone Note  I connected with pt on 05/04/20 at  5:00 PM EST by telephone and verified that I am speaking with the correct person using two identifiers.   I discussed the limitations, risks, security and privacy concerns of performing an evaluation and management service by telephone and the availability of in person appointments. I also discussed with the patient that there may be a patient responsible charge related to this service. The patient expressed understanding and agreed to proceed.   I discussed the assessment and treatment plan with the patient. The patient was provided an opportunity to ask questions and all were answered. The patient agreed with the plan and demonstrated an understanding of the instructions.   The patient was advised to call back or seek an in-person evaluation if the symptoms worsen or if the condition fails to improve as anticipated.  I provided 20 minutes of non-face-to-face time during this encounter.  The patient was located at home.  The provider was located at [facility].   Dorothyann Gibbs, NP   Subjective:   Patient ID:  Ralph Malone is a 29 y.o. (DOB 1991-01-05) male.  Chief Complaint: No chief complaint on file.   HPI Ralph Malone presents for follow-up of MDD, GAD, and insomnia.  Describes mood today as "ok". Pleasant. Mood symptoms - reports decreased depression, anxiety, and irritability. Felt like the Trintellix was helpful initially, but is not working as well as it was. Stating "I do feel better than I did". Also stating "I have good and bad days". Still worries about "everything". Obsesses over anything he can focus on. Also stating "that effects my sleep". Currently has a respiratory infection. Working long hours. Improved interest and motivation. Taking medications as prescribed.  Energy levels lower with recent illness. Active, does not have a regular exercise  routine.  Enjoys some usual interests and activities. Married. Lives with wife of 7 years and 2 children - d-6 and s-4. No family local. Spending time with family.   Appetite adequate. Weight stable - 155 pounds. Sleeps well most nights. Averages 4 hours.  Focus and concentration difficulties. Completing tasks. Managing aspects of household. 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  Denies SI or HI. Denies AH or VH.  Previous medication trials: Zoloft  Review of Systems:  Review of Systems  Musculoskeletal: Negative for gait problem.  Neurological: Negative for tremors.  Psychiatric/Behavioral:       Please refer to HPI    Medications: I have reviewed the patient's current medications.  Current Outpatient Medications  Medication Sig Dispense Refill  . cetirizine (ZYRTEC) 10 MG tablet Take 10 mg by mouth daily as needed for allergies.    Marland Kitchen vortioxetine HBr (TRINTELLIX) 20 MG TABS tablet Take one tablet daily. 30 tablet 2   No current facility-administered medications for this visit.    Medication Side Effects: None  Allergies: No Known Allergies  No past medical history on file.  No family history on file.  Social History   Socioeconomic History  . Marital status: Married    Spouse name: Not on file  . Number of children: Not on file  . Years of education: Not on file  . Highest education level: Not on file  Occupational History  . Not on file  Tobacco Use  . Smoking status: Never Smoker  . Smokeless tobacco: Never Used  Vaping Use  . Vaping Use: Never used  Substance and Sexual Activity  . Alcohol use: Yes    Comment: social   . Drug use: Never  . Sexual activity: Not on file  Other Topics Concern  . Not on file  Social History Narrative  . Not on file   Social Determinants of Health   Financial Resource Strain:   . Difficulty of Paying Living Expenses: Not on file  Food Insecurity:   . Worried About Programme researcher, broadcasting/film/video  in the Last Year: Not on file  . Ran Out of Food in the Last Year: Not on file  Transportation Needs:   . Lack of Transportation (Medical): Not on file  . Lack of Transportation (Non-Medical): Not on file  Physical Activity:   . Days of Exercise per Week: Not on file  . Minutes of Exercise per Session: Not on file  Stress:   . Feeling of Stress : Not on file  Social Connections:   . Frequency of Communication with Friends and Family: Not on file  . Frequency of Social Gatherings with Friends and Family: Not on file  . Attends Religious Services: Not on file  . Active Member of Clubs or Organizations: Not on file  . Attends Banker Meetings: Not on file  . Marital Status: Not on file  Intimate Partner Violence:   . Fear of Current or Ex-Partner: Not on file  . Emotionally Abused: Not on file  . Physically Abused: Not on file  . Sexually Abused: Not on file    Past Medical History, Surgical history, Social history, and Family history were reviewed and updated as appropriate.   Please see review of systems for further details on the patient's review from today.   Objective:   Physical Exam:  There were no vitals taken for this visit.  Physical Exam Constitutional:      General: He is not in acute distress. Musculoskeletal:        General: No deformity.  Neurological:     Mental Status: He is alert and oriented to person, place, and time.     Coordination: Coordination normal.  Psychiatric:        Attention and Perception: Attention and perception normal. He does not perceive auditory or visual hallucinations.        Mood and Affect: Mood normal. Mood is not anxious or depressed. Affect is not labile, blunt, angry or inappropriate.        Speech: Speech normal.        Behavior: Behavior normal.        Thought Content: Thought content normal. Thought content is not paranoid or delusional. Thought content does not include homicidal or suicidal ideation. Thought  content does not include homicidal or suicidal plan.        Cognition and Memory: Cognition and memory normal.        Judgment: Judgment normal.     Comments: Insight intact     Lab Review:     Component Value Date/Time   NA 138 07/11/2019 0546   K 3.9 07/11/2019 0546   CL 103 07/11/2019 0546   CO2 28 07/11/2019 0546   GLUCOSE 112 (H) 07/11/2019 0546   BUN 11 07/11/2019 0546   CREATININE 0.85 07/11/2019 0546   CALCIUM 8.8 (L) 07/11/2019 0546   PROT 8.0 07/10/2019 0541   ALBUMIN 4.4 07/10/2019 0541   AST 17 07/10/2019 0541   ALT 13 07/10/2019 0541   ALKPHOS  49 07/10/2019 0541   BILITOT 0.5 07/10/2019 0541   GFRNONAA >60 07/11/2019 0546   GFRAA >60 07/11/2019 0546       Component Value Date/Time   WBC 12.4 (H) 07/11/2019 0546   RBC 4.64 07/11/2019 0546   HGB 13.7 07/11/2019 0546   HCT 39.2 07/11/2019 0546   PLT 260 07/11/2019 0546   MCV 84.5 07/11/2019 0546   MCH 29.5 07/11/2019 0546   MCHC 34.9 07/11/2019 0546   RDW 12.1 07/11/2019 0546    No results found for: POCLITH, LITHIUM   No results found for: PHENYTOIN, PHENOBARB, VALPROATE, CBMZ   .res Assessment: Plan:    Plan:  PDMP reviewed  Gene Sight testing completed    1. Increase Trintellix 10mg  to 20mg  daily  RTC 4 weeks  Patient advised to contact office with any questions, adverse effects, or acute worsening in signs and symptoms.    Diagnoses and all orders for this visit:  Major depressive disorder, recurrent episode, moderate (HCC) -     vortioxetine HBr (TRINTELLIX) 20 MG TABS tablet; Take one tablet daily.  Generalized anxiety disorder -     vortioxetine HBr (TRINTELLIX) 20 MG TABS tablet; Take one tablet daily.  Insomnia, unspecified type    Please see After Visit Summary for patient specific instructions.  No future appointments.  No orders of the defined types were placed in this encounter.     -------------------------------

## 2020-07-26 ENCOUNTER — Other Ambulatory Visit: Payer: Self-pay | Admitting: Adult Health

## 2020-07-26 DIAGNOSIS — F331 Major depressive disorder, recurrent, moderate: Secondary | ICD-10-CM

## 2020-07-26 DIAGNOSIS — F411 Generalized anxiety disorder: Secondary | ICD-10-CM

## 2020-10-25 ENCOUNTER — Other Ambulatory Visit: Payer: Self-pay

## 2020-10-25 ENCOUNTER — Ambulatory Visit (INDEPENDENT_AMBULATORY_CARE_PROVIDER_SITE_OTHER): Payer: No Typology Code available for payment source | Admitting: Family Medicine

## 2020-10-25 VITALS — BP 122/70 | Ht 66.0 in | Wt 155.0 lb

## 2020-10-25 DIAGNOSIS — M7711 Lateral epicondylitis, right elbow: Secondary | ICD-10-CM | POA: Diagnosis not present

## 2020-10-25 MED ORDER — MELOXICAM 15 MG PO TABS: 15.0000 mg | ORAL_TABLET | Freq: Every day | ORAL | 2 refills | Status: DC

## 2020-10-25 NOTE — Progress Notes (Signed)
PCP: Hillery Aldo, PA-C  Subjective:   HPI: Patient is a 30 y.o. male here for right elbow pain.  Experiencing lateral right elbow pain for 3-4 weeks.  No known injury or falls to arm.  Denies audible snap or pop.  Is right-hand dominant.  Works for [employer] and admits to lifting heavier objects greater than 15 pounds.  Pain is elicited with lifting objects or bending wrist.  Using ibuprofen with little improvement.  No past medical history on file.  Current Outpatient Medications on File Prior to Visit  Medication Sig Dispense Refill  . TRINTELLIX 20 MG TABS tablet TAKE ONE TABLET BY MOUTH ONE TIME DAILY 30 tablet 2  . cetirizine (ZYRTEC) 10 MG tablet Take 10 mg by mouth daily as needed for allergies.     No current facility-administered medications on file prior to visit.    Past Surgical History:  Procedure Laterality Date  . LAPAROSCOPIC APPENDECTOMY N/A 07/10/2019   Procedure: APPENDECTOMY LAPAROSCOPIC;  Surgeon: Duanne Guess, MD;  Location: [facility] ORS;  Service: General;  Laterality: N/A;    No Known Allergies  Social History   Socioeconomic History  . Marital status: Married    Spouse name: Not on file  . Number of children: Not on file  . Years of education: Not on file  . Highest education level: Not on file  Occupational History  . Not on file  Tobacco Use  . Smoking status: Never Smoker  . Smokeless tobacco: Never Used  Vaping Use  . Vaping Use: Never used  Substance and Sexual Activity  . Alcohol use: Yes    Comment: social   . Drug use: Never  . Sexual activity: Not on file  Other Topics Concern  . Not on file  Social History Narrative  . Not on file   Social Determinants of Health   Financial Resource Strain: Not on file  Food Insecurity: Not on file  Transportation Needs: Not on file  Physical Activity: Not on file  Stress: Not on file  Social Connections: Not on file  Intimate Partner Violence: Not on file    No family history on  file.  BP 122/70   Ht 5\' 6"  (1.676 m)   Wt 155 lb (70.3 kg)   BMI 25.02 kg/m   [facility] 10/25/2020  Frequency of strengthening activities (# of days/week) 0    No flowsheet data found.  Review of Systems: See HPI above.     Objective:  Physical Exam:  Gen: NAD, comfortable in exam room  Elbow No gross deformity, swelling, bruising. TTP right lateral epicondyle.  FROM. BUE strength 5/5.   2+ equal reflexes in triceps, biceps, brachioradialis tendons. Medial and lateral collateral ligaments stable. Pain elicited at right lateral epicondyle with resisted wrist extension. NV intact distal BUEs.   Assessment & Plan:  1.  Right lateral epicondylitis  Acute.  Ongoing 3-4 weeks.  Likely due to overuse from job with heavy lifting.   Plan: - Ice 3-4 times a day for 15 minutes at a time as needed -Start meloxicam 15 mg daily, can take Tylenol in addition if needed - Home exercises given - Counterforce brace - Consider formal physical therapy or nitro patches if no improvement - Follow-up in 6 weeks or sooner if needed

## 2020-10-25 NOTE — Patient Instructions (Signed)
You have lateral epicondylitis Try to avoid painful activities as much as possible. Ice the area 3-4 times a day for 15 minutes at a time as needed. Meloxicam 15mg  daily with food for pain and inflammation - don't take aleve or ibuprofen while on this. Ok to take tylenol though if you need something in addition to the meloxicam. Counterforce brace as directed can help unload area - wear this regularly if it provides you with relief. Hammer rotation exercise, wrist extension exercise with 1 pound weight - 3 sets of 10 once a day.   Stretching - hold for 20-30 seconds and repeat 3 times. Consider physical therapy, injection, nitro patches if not improving. Follow up in 6 weeks but contact me sooner if you're struggling.

## 2020-10-26 ENCOUNTER — Encounter: Payer: Self-pay | Admitting: Family Medicine

## 2020-12-13 ENCOUNTER — Other Ambulatory Visit: Payer: Self-pay

## 2020-12-13 ENCOUNTER — Ambulatory Visit (INDEPENDENT_AMBULATORY_CARE_PROVIDER_SITE_OTHER): Payer: No Typology Code available for payment source | Admitting: Family Medicine

## 2020-12-13 ENCOUNTER — Encounter: Payer: Self-pay | Admitting: Family Medicine

## 2020-12-13 VITALS — BP 124/78 | Ht 66.0 in | Wt 160.0 lb

## 2020-12-13 DIAGNOSIS — M7711 Lateral epicondylitis, right elbow: Secondary | ICD-10-CM

## 2020-12-13 MED ORDER — NITROGLYCERIN 0.2 MG/HR TD PT24: MEDICATED_PATCH | TRANSDERMAL | 1 refills | Status: AC

## 2020-12-13 NOTE — Assessment & Plan Note (Signed)
Worsened pain, has not been completing exericses -Nitro patches -Tylenol/ Ibuprofen PRN -Continue counterforce brace and consider wrist brace -Hammer rotation exercise, wrist extension exercise and stretching  -Consider PT or injection if not improving. -Follow-up 6 weeks

## 2020-12-13 NOTE — Progress Notes (Signed)
   Ralph Malone is a 30 y.o. male who presents to [facility] today for the following:  Lateral epicondylitis Patient reports that the pain is now worse and constant, especially worse at night.  More intense pain (burning/sharp pain rated as a 6/10) and is now radiating down his arm somewhat.   He is using the counterforce brace at work.   Was using meloxicam but states it did not work.  Ibuprofen helps somewhat. Is not using ice Does not do the exercises previously prescribed Not interested in formal PT at this time Works 14-hour days 5 days/week.  States that he is switching jobs to a less physical job August 8, which will likely improve his symptoms   PMH reviewed.  ROS as above. Medications reviewed.  Exam:  BP 124/78   Ht 5\' 6"  (1.676 m)   Wt 160 lb (72.6 kg)   BMI 25.82 kg/m  Gen: Well-appearing, NAD MSK:  Elbow: - Inspection: no obvious deformity. No swelling, erythema or bruising - Palpation: mildly TTP on the lateral epicondyle.  - ROM: full active ROM in flexion and extension. No crepitus - Strength: 5/5 strength in wrist flexion and extension without pain. 5/5 strength in biceps, triceps. - Neuro: NV intact distally - Special testing: no laxity with varus/valgus stress. Pain with resisted ECRB testing, no pain with supination or 3rd digit extension. Negative tinel's at radial tunnel   No results found.   Assessment and Plan: 1) Right lateral epicondylitis Worsened pain, has not been completing exericses -Nitro patches -Tylenol/ Ibuprofen PRN -Continue counterforce brace and consider wrist brace -Hammer rotation exercise, wrist extension exercise and stretching  -Consider PT or injection if not improving. -Follow-up 6 weeks   Iliani Vejar, DO

## 2020-12-13 NOTE — Patient Instructions (Signed)
You have lateral epicondylitis Start nitro patches 1/4th patch to affected area, change daily. Try to avoid painful activities as much as possible. Ice the area 3-4 times a day for 15 minutes at a time as needed. Tylenol, Ibuprofen if needed. Counterforce brace as directed can help unload area - wear this regularly if it provides you with relief. Consider a wrist brace also when at work. Hammer rotation exercise, wrist extension exercise with 1 pound weight - 3 sets of 10 once a day.   Stretching - hold for 20-30 seconds and repeat 3 times. Consider physical therapy, injection if not improving. Follow up in 6 weeks but contact me sooner if you're struggling.

## 2020-12-15 ENCOUNTER — Other Ambulatory Visit: Payer: Self-pay | Admitting: Adult Health

## 2020-12-15 DIAGNOSIS — F411 Generalized anxiety disorder: Secondary | ICD-10-CM

## 2020-12-15 DIAGNOSIS — F331 Major depressive disorder, recurrent, moderate: Secondary | ICD-10-CM

## 2021-01-03 DIAGNOSIS — Z419 Encounter for procedure for purposes other than remedying health state, unspecified: Secondary | ICD-10-CM | POA: Diagnosis not present

## 2021-02-03 DIAGNOSIS — Z419 Encounter for procedure for purposes other than remedying health state, unspecified: Secondary | ICD-10-CM | POA: Diagnosis not present

## 2021-03-05 DIAGNOSIS — Z419 Encounter for procedure for purposes other than remedying health state, unspecified: Secondary | ICD-10-CM | POA: Diagnosis not present

## 2021-03-20 IMAGING — CT CT ABD-PELV W/ CM
2 of 4 series · 16 of 46 positions shown, 18 images · IV contrast (APPLIED)
Comparison: None.

CLINICAL DATA: Lower abdominal pain with constipation for 3-4 days.

EXAM:
CT ABDOMEN AND PELVIS WITH CONTRAST
TECHNIQUE: Multidetector CT imaging of the abdomen and pelvis was performed
using the standard protocol following bolus administration of
intravenous contrast.
CONTRAST:  100mL OMNIPAQUE IOHEXOL 300 MG/ML  SOLN

[Series 2: routine abd/pel with · axial · 0.65mm/px · z∈[-958,-572]mm · 13 of 85 slices shown, 15 images]
[im 4/85  soft-tissue]
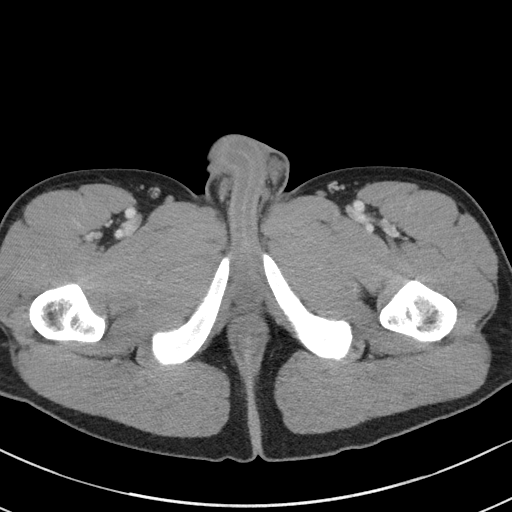
[im 4/85  bone]
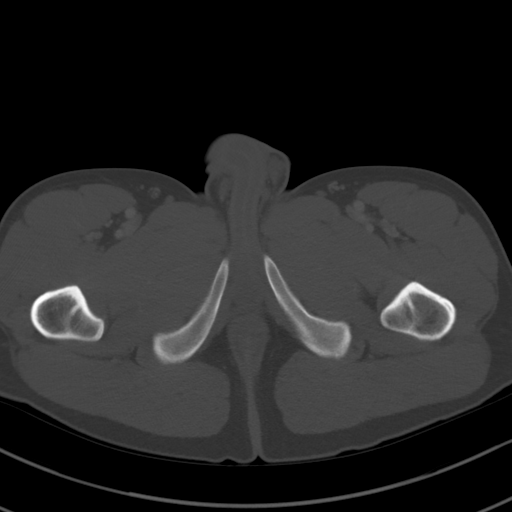
[im 11/85  soft-tissue]
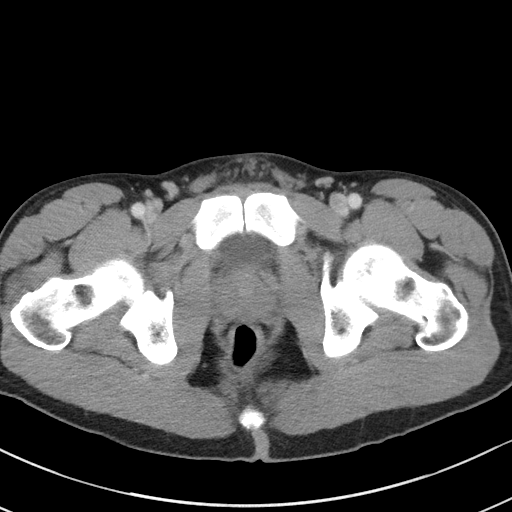
[im 17/85  soft-tissue]
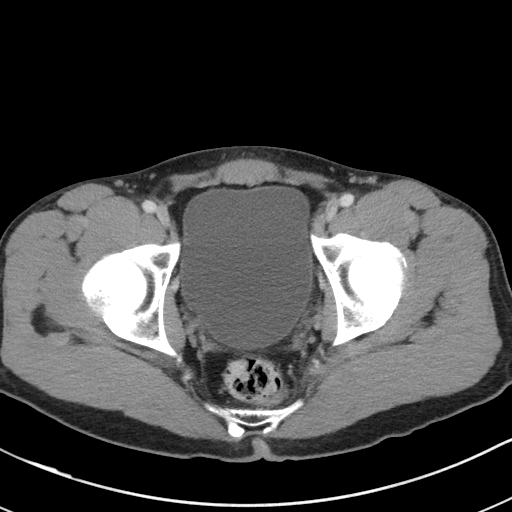
[im 24/85  soft-tissue]
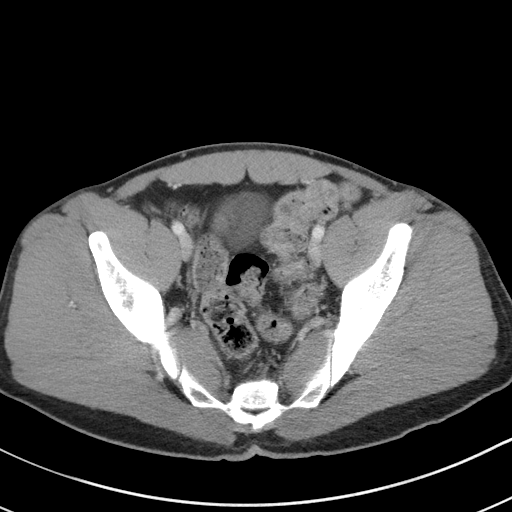
[im 31/85  soft-tissue]
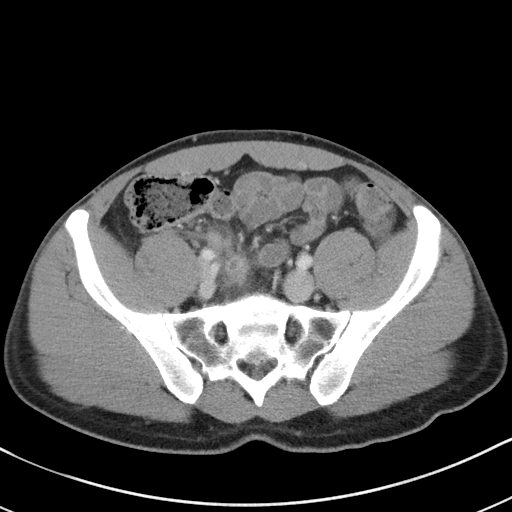
[im 37/85  soft-tissue]
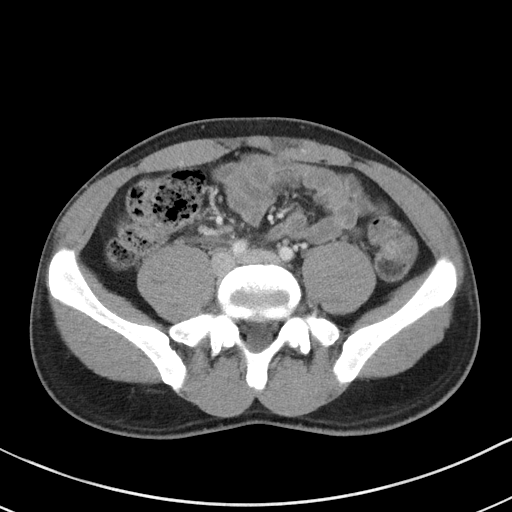
[im 44/85  soft-tissue]
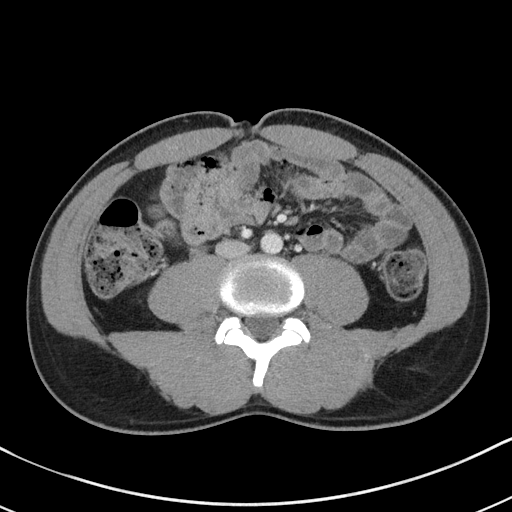
[im 48/85  soft-tissue]
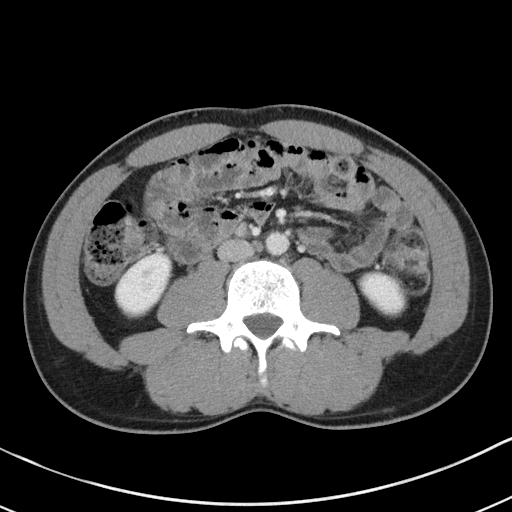
[im 54/85  soft-tissue]
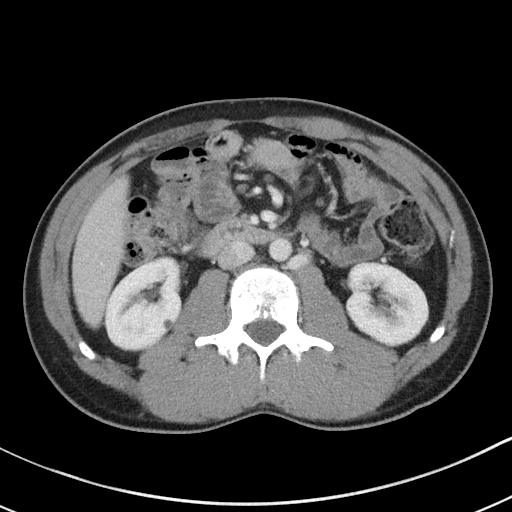
[im 54/85  bone]
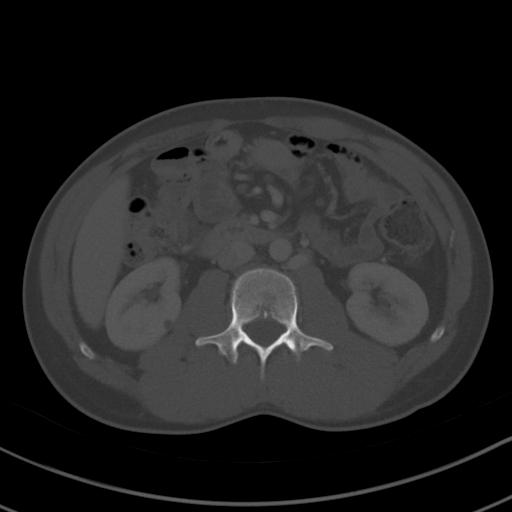
[im 61/85  soft-tissue]
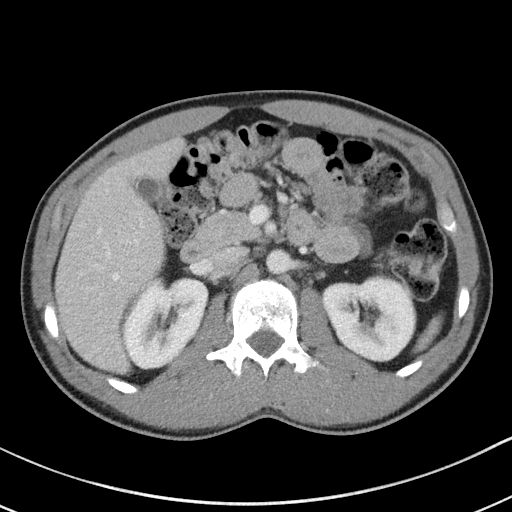
[im 68/85  soft-tissue]
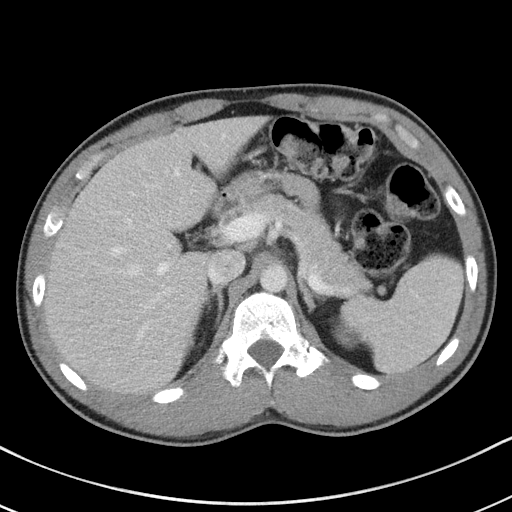
[im 74/85  soft-tissue]
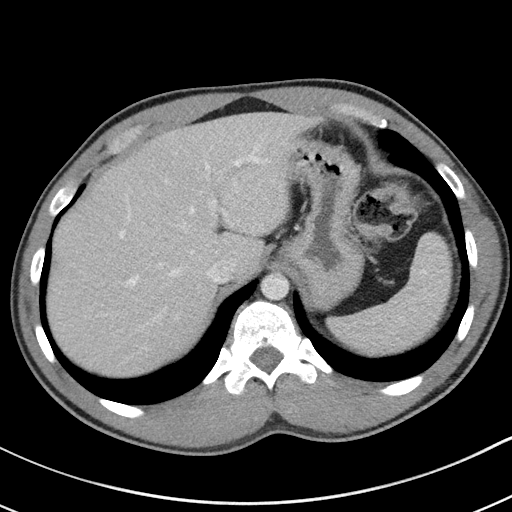
[im 81/85  soft-tissue]
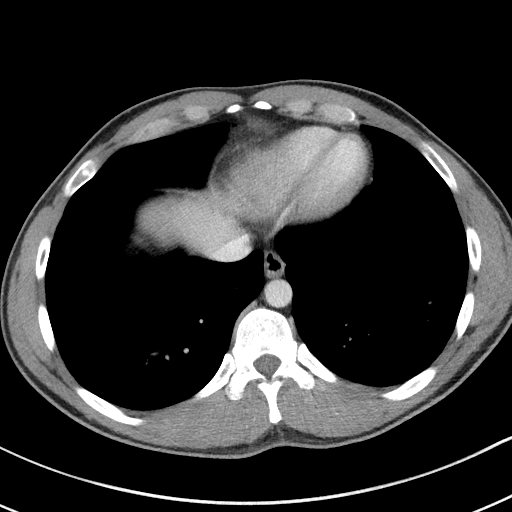

[Series 5: coronal st · coronal · 0.64mm/px · 3 of 74 slices shown]
[im 25/74  soft-tissue]
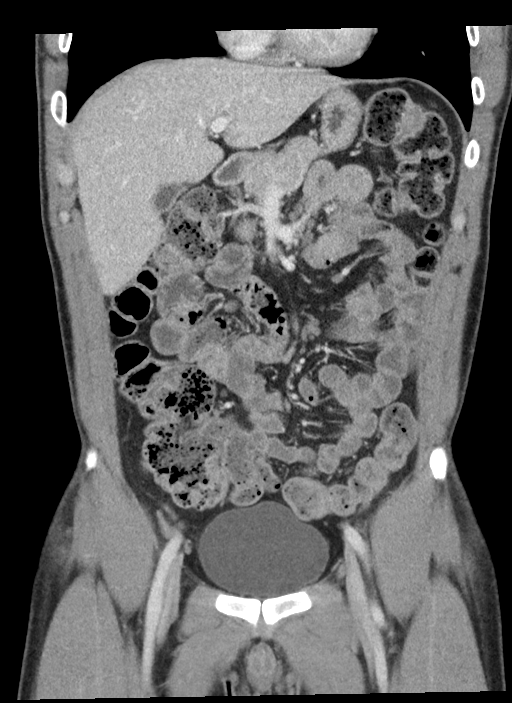
[im 33/74  soft-tissue]
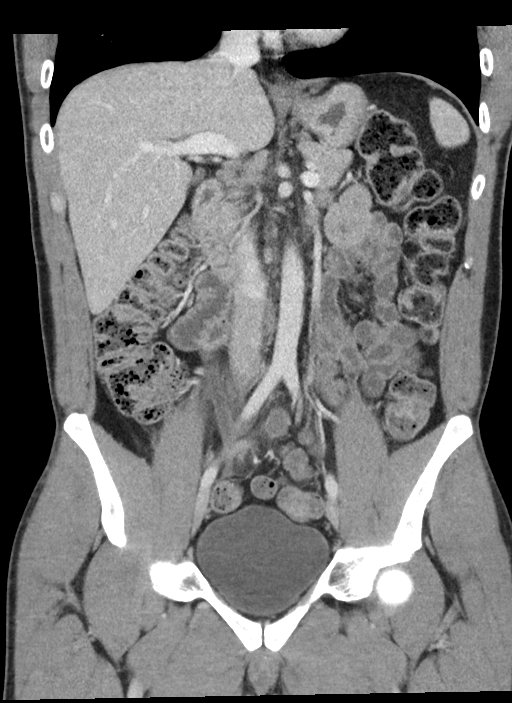
[im 41/74  soft-tissue]
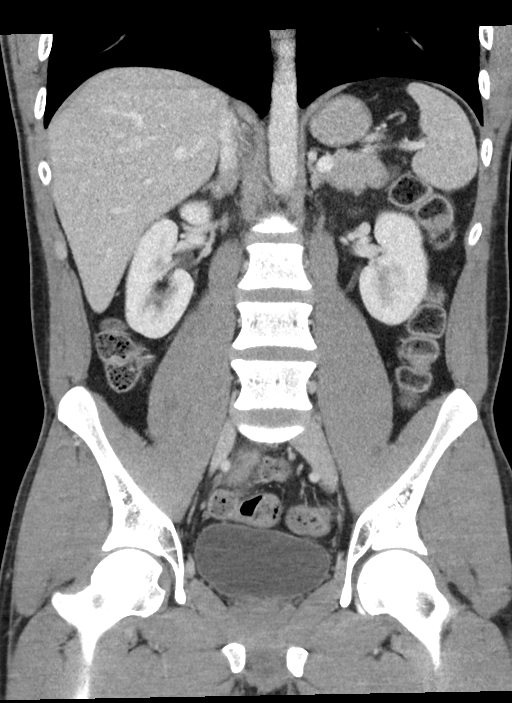

[16 of 46 positions shown; findings below may reference images not displayed]

FINDINGS: Lower chest:  No contributory findings.

Hepatobiliary: No focal liver abnormality.No evidence of biliary
obstruction or stone.

Pancreas: Unremarkable.

Spleen: Unremarkable.

Adrenals/Urinary Tract: Negative adrenals. No hydronephrosis or
stone. Tiny cystic density in the right kidney. Unremarkable
bladder.

Stomach/Bowel: Wall thickening of the appendix with stone and
mesoappendiceal stranding. At the midportion the appendix measures
up to 13 mm in diameter where the wall is somewhat irregular. No
collection is seen. The appendix is in expected location inferior to
the cecum.

Vascular/Lymphatic: No acute vascular abnormality. No mass or
adenopathy.

Reproductive:No pathologic findings.

Other: No ascites or pneumoperitoneum.

Musculoskeletal: No acute abnormalities.  L5 limbus.

These results were called by telephone at the time of interpretation
on 07/10/2019 at [DATE] to provider LKW TIGER , who verbally
acknowledged these results.
IMPRESSION: Acute suppurative appendicitis. The wall is somewhat irregular at
the midportion but no clear perforation and no abscess. An
appendicolith is present.

## 2021-04-05 DIAGNOSIS — Z419 Encounter for procedure for purposes other than remedying health state, unspecified: Secondary | ICD-10-CM | POA: Diagnosis not present

## 2021-05-05 DIAGNOSIS — Z419 Encounter for procedure for purposes other than remedying health state, unspecified: Secondary | ICD-10-CM | POA: Diagnosis not present

## 2021-05-14 DIAGNOSIS — Z13228 Encounter for screening for other metabolic disorders: Secondary | ICD-10-CM | POA: Diagnosis not present

## 2021-05-18 DIAGNOSIS — J111 Influenza due to unidentified influenza virus with other respiratory manifestations: Secondary | ICD-10-CM | POA: Diagnosis not present

## 2021-05-18 DIAGNOSIS — Z20822 Contact with and (suspected) exposure to covid-19: Secondary | ICD-10-CM | POA: Diagnosis not present

## 2021-05-18 DIAGNOSIS — R059 Cough, unspecified: Secondary | ICD-10-CM | POA: Diagnosis not present

## 2021-05-19 DIAGNOSIS — Z20822 Contact with and (suspected) exposure to covid-19: Secondary | ICD-10-CM | POA: Diagnosis not present

## 2021-05-23 DIAGNOSIS — U071 COVID-19: Secondary | ICD-10-CM | POA: Diagnosis not present

## 2021-05-23 DIAGNOSIS — Z8616 Personal history of COVID-19: Secondary | ICD-10-CM | POA: Diagnosis not present

## 2021-05-23 DIAGNOSIS — R0989 Other specified symptoms and signs involving the circulatory and respiratory systems: Secondary | ICD-10-CM | POA: Diagnosis not present

## 2021-06-05 DIAGNOSIS — Z419 Encounter for procedure for purposes other than remedying health state, unspecified: Secondary | ICD-10-CM | POA: Diagnosis not present

## 2021-07-06 DIAGNOSIS — Z419 Encounter for procedure for purposes other than remedying health state, unspecified: Secondary | ICD-10-CM | POA: Diagnosis not present

## 2021-08-03 DIAGNOSIS — Z419 Encounter for procedure for purposes other than remedying health state, unspecified: Secondary | ICD-10-CM | POA: Diagnosis not present

## 2021-08-09 DIAGNOSIS — M47896 Other spondylosis, lumbar region: Secondary | ICD-10-CM | POA: Diagnosis not present

## 2021-08-09 DIAGNOSIS — S39012A Strain of muscle, fascia and tendon of lower back, initial encounter: Secondary | ICD-10-CM | POA: Diagnosis not present

## 2021-08-09 DIAGNOSIS — M545 Low back pain, unspecified: Secondary | ICD-10-CM | POA: Diagnosis not present

## 2021-08-30 DIAGNOSIS — Z3009 Encounter for other general counseling and advice on contraception: Secondary | ICD-10-CM | POA: Diagnosis not present

## 2021-09-03 DIAGNOSIS — Z419 Encounter for procedure for purposes other than remedying health state, unspecified: Secondary | ICD-10-CM | POA: Diagnosis not present

## 2021-09-20 DIAGNOSIS — Z302 Encounter for sterilization: Secondary | ICD-10-CM | POA: Diagnosis not present

## 2021-10-03 DIAGNOSIS — Z419 Encounter for procedure for purposes other than remedying health state, unspecified: Secondary | ICD-10-CM | POA: Diagnosis not present

## 2021-11-03 DIAGNOSIS — Z419 Encounter for procedure for purposes other than remedying health state, unspecified: Secondary | ICD-10-CM | POA: Diagnosis not present

## 2021-12-03 DIAGNOSIS — Z419 Encounter for procedure for purposes other than remedying health state, unspecified: Secondary | ICD-10-CM | POA: Diagnosis not present

## 2022-01-03 DIAGNOSIS — Z419 Encounter for procedure for purposes other than remedying health state, unspecified: Secondary | ICD-10-CM | POA: Diagnosis not present

## 2022-02-01 DIAGNOSIS — F419 Anxiety disorder, unspecified: Secondary | ICD-10-CM | POA: Diagnosis not present

## 2022-02-03 DIAGNOSIS — Z419 Encounter for procedure for purposes other than remedying health state, unspecified: Secondary | ICD-10-CM | POA: Diagnosis not present

## 2022-02-15 DIAGNOSIS — F419 Anxiety disorder, unspecified: Secondary | ICD-10-CM | POA: Diagnosis not present

## 2022-03-01 DIAGNOSIS — F419 Anxiety disorder, unspecified: Secondary | ICD-10-CM | POA: Diagnosis not present

## 2022-03-05 DIAGNOSIS — Z419 Encounter for procedure for purposes other than remedying health state, unspecified: Secondary | ICD-10-CM | POA: Diagnosis not present

## 2022-03-15 DIAGNOSIS — F419 Anxiety disorder, unspecified: Secondary | ICD-10-CM | POA: Diagnosis not present

## 2022-03-29 DIAGNOSIS — F419 Anxiety disorder, unspecified: Secondary | ICD-10-CM | POA: Diagnosis not present

## 2022-04-05 DIAGNOSIS — Z419 Encounter for procedure for purposes other than remedying health state, unspecified: Secondary | ICD-10-CM | POA: Diagnosis not present

## 2022-04-12 DIAGNOSIS — F419 Anxiety disorder, unspecified: Secondary | ICD-10-CM | POA: Diagnosis not present

## 2022-04-19 DIAGNOSIS — F411 Generalized anxiety disorder: Secondary | ICD-10-CM | POA: Diagnosis not present

## 2022-04-19 DIAGNOSIS — F1091 Alcohol use, unspecified, in remission: Secondary | ICD-10-CM | POA: Diagnosis not present

## 2022-04-19 DIAGNOSIS — F331 Major depressive disorder, recurrent, moderate: Secondary | ICD-10-CM | POA: Diagnosis not present

## 2022-04-26 DIAGNOSIS — F331 Major depressive disorder, recurrent, moderate: Secondary | ICD-10-CM | POA: Diagnosis not present

## 2022-04-26 DIAGNOSIS — F419 Anxiety disorder, unspecified: Secondary | ICD-10-CM | POA: Diagnosis not present

## 2022-04-26 DIAGNOSIS — F1091 Alcohol use, unspecified, in remission: Secondary | ICD-10-CM | POA: Diagnosis not present

## 2022-05-03 DIAGNOSIS — F1091 Alcohol use, unspecified, in remission: Secondary | ICD-10-CM | POA: Diagnosis not present

## 2022-05-03 DIAGNOSIS — F419 Anxiety disorder, unspecified: Secondary | ICD-10-CM | POA: Diagnosis not present

## 2022-05-03 DIAGNOSIS — F411 Generalized anxiety disorder: Secondary | ICD-10-CM | POA: Diagnosis not present

## 2022-05-03 DIAGNOSIS — F331 Major depressive disorder, recurrent, moderate: Secondary | ICD-10-CM | POA: Diagnosis not present

## 2022-05-05 DIAGNOSIS — Z419 Encounter for procedure for purposes other than remedying health state, unspecified: Secondary | ICD-10-CM | POA: Diagnosis not present

## 2022-05-17 DIAGNOSIS — F411 Generalized anxiety disorder: Secondary | ICD-10-CM | POA: Diagnosis not present

## 2022-05-17 DIAGNOSIS — F909 Attention-deficit hyperactivity disorder, unspecified type: Secondary | ICD-10-CM | POA: Diagnosis not present

## 2022-05-17 DIAGNOSIS — F331 Major depressive disorder, recurrent, moderate: Secondary | ICD-10-CM | POA: Diagnosis not present

## 2022-05-17 DIAGNOSIS — F1091 Alcohol use, unspecified, in remission: Secondary | ICD-10-CM | POA: Diagnosis not present

## 2022-05-24 DIAGNOSIS — F411 Generalized anxiety disorder: Secondary | ICD-10-CM | POA: Diagnosis not present

## 2022-05-24 DIAGNOSIS — F331 Major depressive disorder, recurrent, moderate: Secondary | ICD-10-CM | POA: Diagnosis not present

## 2022-05-24 DIAGNOSIS — F1091 Alcohol use, unspecified, in remission: Secondary | ICD-10-CM | POA: Diagnosis not present

## 2022-05-30 DIAGNOSIS — F411 Generalized anxiety disorder: Secondary | ICD-10-CM | POA: Diagnosis not present

## 2022-05-30 DIAGNOSIS — F331 Major depressive disorder, recurrent, moderate: Secondary | ICD-10-CM | POA: Diagnosis not present

## 2022-05-30 DIAGNOSIS — F909 Attention-deficit hyperactivity disorder, unspecified type: Secondary | ICD-10-CM | POA: Diagnosis not present

## 2022-05-30 DIAGNOSIS — F1091 Alcohol use, unspecified, in remission: Secondary | ICD-10-CM | POA: Diagnosis not present

## 2022-06-05 DIAGNOSIS — Z419 Encounter for procedure for purposes other than remedying health state, unspecified: Secondary | ICD-10-CM | POA: Diagnosis not present

## 2022-07-06 DIAGNOSIS — Z419 Encounter for procedure for purposes other than remedying health state, unspecified: Secondary | ICD-10-CM | POA: Diagnosis not present

## 2022-08-04 DIAGNOSIS — Z419 Encounter for procedure for purposes other than remedying health state, unspecified: Secondary | ICD-10-CM | POA: Diagnosis not present

## 2022-09-04 DIAGNOSIS — Z419 Encounter for procedure for purposes other than remedying health state, unspecified: Secondary | ICD-10-CM | POA: Diagnosis not present

## 2022-10-04 DIAGNOSIS — Z419 Encounter for procedure for purposes other than remedying health state, unspecified: Secondary | ICD-10-CM | POA: Diagnosis not present

## 2022-11-04 DIAGNOSIS — Z419 Encounter for procedure for purposes other than remedying health state, unspecified: Secondary | ICD-10-CM | POA: Diagnosis not present

## 2022-12-04 DIAGNOSIS — Z419 Encounter for procedure for purposes other than remedying health state, unspecified: Secondary | ICD-10-CM | POA: Diagnosis not present

## 2023-01-04 DIAGNOSIS — Z419 Encounter for procedure for purposes other than remedying health state, unspecified: Secondary | ICD-10-CM | POA: Diagnosis not present

## 2023-02-04 DIAGNOSIS — Z419 Encounter for procedure for purposes other than remedying health state, unspecified: Secondary | ICD-10-CM | POA: Diagnosis not present

## 2023-03-06 DIAGNOSIS — Z419 Encounter for procedure for purposes other than remedying health state, unspecified: Secondary | ICD-10-CM | POA: Diagnosis not present

## 2023-03-22 ENCOUNTER — Encounter: Payer: Self-pay | Admitting: Adult Health

## 2023-04-06 DIAGNOSIS — Z419 Encounter for procedure for purposes other than remedying health state, unspecified: Secondary | ICD-10-CM | POA: Diagnosis not present

## 2023-05-06 DIAGNOSIS — Z419 Encounter for procedure for purposes other than remedying health state, unspecified: Secondary | ICD-10-CM | POA: Diagnosis not present

## 2023-06-06 DIAGNOSIS — Z419 Encounter for procedure for purposes other than remedying health state, unspecified: Secondary | ICD-10-CM | POA: Diagnosis not present

## 2023-07-07 DIAGNOSIS — Z419 Encounter for procedure for purposes other than remedying health state, unspecified: Secondary | ICD-10-CM | POA: Diagnosis not present

## 2023-08-04 DIAGNOSIS — Z419 Encounter for procedure for purposes other than remedying health state, unspecified: Secondary | ICD-10-CM | POA: Diagnosis not present

## 2023-09-15 DIAGNOSIS — Z419 Encounter for procedure for purposes other than remedying health state, unspecified: Secondary | ICD-10-CM | POA: Diagnosis not present

## 2023-10-15 DIAGNOSIS — Z419 Encounter for procedure for purposes other than remedying health state, unspecified: Secondary | ICD-10-CM | POA: Diagnosis not present

## 2023-11-15 DIAGNOSIS — Z419 Encounter for procedure for purposes other than remedying health state, unspecified: Secondary | ICD-10-CM | POA: Diagnosis not present

## 2023-12-15 DIAGNOSIS — Z419 Encounter for procedure for purposes other than remedying health state, unspecified: Secondary | ICD-10-CM | POA: Diagnosis not present

## 2024-01-15 DIAGNOSIS — Z419 Encounter for procedure for purposes other than remedying health state, unspecified: Secondary | ICD-10-CM | POA: Diagnosis not present

## 2024-02-15 DIAGNOSIS — Z419 Encounter for procedure for purposes other than remedying health state, unspecified: Secondary | ICD-10-CM | POA: Diagnosis not present
